# Patient Record
Sex: Female | Born: 1990 | Race: White | Hispanic: No | Marital: Married | Smoking: Never smoker
Health system: Southern US, Community
[De-identification: ages and names within clinical notes are randomized; demographics above are authoritative.]

## PROBLEM LIST (undated history)

## (undated) DIAGNOSIS — O24419 Gestational diabetes mellitus in pregnancy, unspecified control: Secondary | ICD-10-CM

## (undated) DIAGNOSIS — O1200 Gestational edema, unspecified trimester: Secondary | ICD-10-CM

## (undated) DIAGNOSIS — R87629 Unspecified abnormal cytological findings in specimens from vagina: Secondary | ICD-10-CM

## (undated) DIAGNOSIS — K5900 Constipation, unspecified: Secondary | ICD-10-CM

## (undated) DIAGNOSIS — R011 Cardiac murmur, unspecified: Secondary | ICD-10-CM

## (undated) DIAGNOSIS — R7303 Prediabetes: Secondary | ICD-10-CM

## (undated) HISTORY — DX: Cardiac murmur, unspecified: R01.1

## (undated) HISTORY — DX: Constipation, unspecified: K59.00

## (undated) HISTORY — DX: Gestational edema, unspecified trimester: O12.00

## (undated) HISTORY — DX: Prediabetes: R73.03

## (undated) HISTORY — PX: COLPOSCOPY: SHX161

---
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
## 2011-02-16 ENCOUNTER — Emergency Department (HOSPITAL_COMMUNITY)
Admission: EM | Admit: 2011-02-16 | Discharge: 2011-02-16 | Disposition: A | Discharge status: Home / Self Care | Payer: Worker's Compensation | Attending: Emergency Medicine | Admitting: Emergency Medicine

## 2011-02-16 DIAGNOSIS — S0100XA Unspecified open wound of scalp, initial encounter: Secondary | ICD-10-CM | POA: Insufficient documentation

## 2011-02-16 DIAGNOSIS — Y9269 Other specified industrial and construction area as the place of occurrence of the external cause: Secondary | ICD-10-CM | POA: Insufficient documentation

## 2011-02-16 DIAGNOSIS — W2209XA Striking against other stationary object, initial encounter: Secondary | ICD-10-CM | POA: Insufficient documentation

## 2015-03-23 LAB — HM PAP SMEAR: HM Pap smear: NEGATIVE

## 2016-01-18 ENCOUNTER — Ambulatory Visit (INDEPENDENT_AMBULATORY_CARE_PROVIDER_SITE_OTHER): Payer: 59 | Admitting: Pediatrics

## 2016-01-18 ENCOUNTER — Encounter: Payer: Self-pay | Admitting: Pediatrics

## 2016-01-18 VITALS — BP 119/78 | HR 74 | Temp 97.8°F | Ht 68.0 in | Wt 178.0 lb

## 2016-01-18 DIAGNOSIS — G43709 Chronic migraine without aura, not intractable, without status migrainosus: Secondary | ICD-10-CM | POA: Diagnosis not present

## 2016-01-18 DIAGNOSIS — Z0000 Encounter for general adult medical examination without abnormal findings: Secondary | ICD-10-CM

## 2016-01-18 MED ORDER — TOPIRAMATE 25 MG PO TABS: 25.0000 mg | ORAL_TABLET | Freq: Two times a day (BID) | ORAL | Status: DC

## 2016-01-18 MED ORDER — RIZATRIPTAN BENZOATE 10 MG PO TABS: 5.0000 mg | ORAL_TABLET | ORAL | Status: DC | PRN

## 2016-01-18 NOTE — Progress Notes (Signed)
    Subjective:    Patient ID: Bonnie Hughes, female    DOB: 10/27/1991, 25 y.o.   MRN: [id number]  CC: annual exam  HPI: Bonnie Hughes is a 25 y.o. female presenting for New Patient (Initial Visit)  Headaches: Most of the time gets nauseous with HA. Starts at the top, runs down back of head Sometimes has a headache in the morning.  Sometimes has a headache at night, there in the morning when she has a headache. Seen eye doctor last year, had normal vision Week before period HA are more severe or frequent Takes ibuprofen sometimes  Has headaches 2-3 times a week Last a couple of hours Being in dark places helps.   Normal BP today Dad with high cholesterol and HTN PGM had MI in 60s No strokes in family  Sept 2016 had flu shot   Depression screen [facility] 2/9 01/18/2016  Decreased Interest 0  Down, Depressed, Hopeless 0  PHQ - 2 Score 0     ROS: All systems negative other than what is in HPI  History  Smoking status  . Never Smoker   Smokeless tobacco  . Not on file    Past Medical History Patient Active Problem List   Diagnosis Date Noted  . Chronic migraine without aura without status migrainosus, not intractable 01/20/2016   Meds: Yaz OCP     Objective:    BP 119/78 mmHg  Pulse 74  Temp(Src) 97.8 F (36.6 C) (Oral)  Ht '5\' 8"'$  (1.727 m)  Wt 178 lb (80.74 kg)  BMI 27.07 kg/m2  Wt Readings from Last 3 Encounters:  01/18/16 178 lb (80.74 kg)     Gen: NAD, alert, cooperative with exam, NCAT EYES: EOMI, no scleral injection or icterus ENT:  TMs pearly gray b/l, OP without erythema LYMPH: no cervical LAD CV: NRRR, normal S1/S2, no murmur, distal pulses 2+ b/l Resp: CTABL, no wheezes, normal WOB Abd: +BS, soft, NTND. no guarding or organomegaly Ext: No edema, warm Neuro: Alert and oriented, strength equal b/l UE and LE, coordination normal, CNIII-XII normal MSK: normal muscle bulk     Assessment & Plan:    Bonnie Hughes was seen today for annual exam.  Diagnoses  and all orders for this visit:  Encounter for preventive health examination -     BMP8+EGFR -     Lipid panel  Chronic migraine without aura without status migrainosus, not intractable Will start ppx med given frequency of migraines, maxalt for abortive therapy. RTC 4 weeks if not improving. -     topiramate (TOPAMAX) 25 MG tablet; Take 1 tablet (25 mg total) by mouth 2 (two) times daily. After a week increase to '50mg'$  BID -     rizatriptan (MAXALT) 10 MG tablet; Take 0.5-1 tablets (5-10 mg total) by mouth as needed for migraine. May repeat in 2 hours if needed   Follow up plan: 4 weeks or as needed  Assunta Found, MD [facility] 01/18/2016, 12:19 PM

## 2016-01-19 LAB — BMP8+EGFR
BUN/Creatinine Ratio: 15 (ref 8–20)
BUN: 12 mg/dL (ref 6–20)
CO2: 23 mmol/L (ref 18–29)
Calcium: 9.5 mg/dL (ref 8.7–10.2)
Chloride: 102 mmol/L (ref 96–106)
Creatinine, Ser: 0.8 mg/dL (ref 0.57–1.00)
GFR calc Af Amer: 119 mL/min/{1.73_m2} (ref >59)
GFR calc non Af Amer: 103 mL/min/{1.73_m2} (ref >59)
Glucose: 89 mg/dL (ref 65–99)
Potassium: 4.3 mmol/L (ref 3.5–5.2)
Sodium: 141 mmol/L (ref 134–144)

## 2016-01-19 LAB — LIPID PANEL
Chol/HDL Ratio: 2.4 ratio units (ref 0.0–4.4)
Cholesterol, Total: 215 mg/dL — ABNORMAL HIGH (ref 100–199)
HDL: 88 mg/dL (ref >39)
LDL Calculated: 106 mg/dL — ABNORMAL HIGH (ref 0–99)
Triglycerides: 107 mg/dL (ref 0–149)
VLDL Cholesterol Cal: 21 mg/dL (ref 5–40)

## 2016-01-20 DIAGNOSIS — G43709 Chronic migraine without aura, not intractable, without status migrainosus: Secondary | ICD-10-CM | POA: Insufficient documentation

## 2016-01-23 DIAGNOSIS — H43393 Other vitreous opacities, bilateral: Secondary | ICD-10-CM | POA: Diagnosis not present

## 2016-01-23 DIAGNOSIS — H52223 Regular astigmatism, bilateral: Secondary | ICD-10-CM | POA: Diagnosis not present

## 2016-01-23 DIAGNOSIS — H5213 Myopia, bilateral: Secondary | ICD-10-CM | POA: Diagnosis not present

## 2016-01-27 ENCOUNTER — Encounter: Payer: Self-pay | Admitting: Pediatrics

## 2016-01-29 ENCOUNTER — Encounter: Payer: Self-pay | Admitting: *Deleted

## 2016-01-30 ENCOUNTER — Encounter: Payer: Self-pay | Admitting: *Deleted

## 2016-06-25 DIAGNOSIS — Z124 Encounter for screening for malignant neoplasm of cervix: Secondary | ICD-10-CM | POA: Diagnosis not present

## 2016-06-25 DIAGNOSIS — Z01419 Encounter for gynecological examination (general) (routine) without abnormal findings: Secondary | ICD-10-CM | POA: Diagnosis not present

## 2016-06-25 DIAGNOSIS — Z6827 Body mass index (BMI) 27.0-27.9, adult: Secondary | ICD-10-CM | POA: Diagnosis not present

## 2016-09-02 DIAGNOSIS — N943 Premenstrual tension syndrome: Secondary | ICD-10-CM | POA: Diagnosis not present

## 2017-01-05 ENCOUNTER — Telehealth: Payer: Worker's Compensation | Admitting: Physician Assistant

## 2017-01-05 DIAGNOSIS — H109 Unspecified conjunctivitis: Secondary | ICD-10-CM

## 2017-01-05 MED ORDER — POLYMYXIN B-TRIMETHOPRIM 10000-0.1 UNIT/ML-% OP SOLN: OPHTHALMIC | 0 refills | Status: DC

## 2017-01-05 NOTE — Progress Notes (Signed)

## 2017-02-04 DIAGNOSIS — H5213 Myopia, bilateral: Secondary | ICD-10-CM | POA: Diagnosis not present

## 2017-02-19 DIAGNOSIS — D485 Neoplasm of uncertain behavior of skin: Secondary | ICD-10-CM | POA: Diagnosis not present

## 2017-02-19 DIAGNOSIS — D235 Other benign neoplasm of skin of trunk: Secondary | ICD-10-CM | POA: Diagnosis not present

## 2017-05-02 DIAGNOSIS — M9901 Segmental and somatic dysfunction of cervical region: Secondary | ICD-10-CM | POA: Diagnosis not present

## 2017-05-02 DIAGNOSIS — M9903 Segmental and somatic dysfunction of lumbar region: Secondary | ICD-10-CM | POA: Diagnosis not present

## 2017-05-02 DIAGNOSIS — M9905 Segmental and somatic dysfunction of pelvic region: Secondary | ICD-10-CM | POA: Diagnosis not present

## 2017-05-02 DIAGNOSIS — M9902 Segmental and somatic dysfunction of thoracic region: Secondary | ICD-10-CM | POA: Diagnosis not present

## 2017-05-05 DIAGNOSIS — L508 Other urticaria: Secondary | ICD-10-CM | POA: Diagnosis not present

## 2017-05-05 DIAGNOSIS — L0100 Impetigo, unspecified: Secondary | ICD-10-CM | POA: Diagnosis not present

## 2017-05-06 DIAGNOSIS — M9903 Segmental and somatic dysfunction of lumbar region: Secondary | ICD-10-CM | POA: Diagnosis not present

## 2017-05-06 DIAGNOSIS — M9905 Segmental and somatic dysfunction of pelvic region: Secondary | ICD-10-CM | POA: Diagnosis not present

## 2017-05-06 DIAGNOSIS — M9902 Segmental and somatic dysfunction of thoracic region: Secondary | ICD-10-CM | POA: Diagnosis not present

## 2017-05-06 DIAGNOSIS — M9901 Segmental and somatic dysfunction of cervical region: Secondary | ICD-10-CM | POA: Diagnosis not present

## 2017-05-20 DIAGNOSIS — M9901 Segmental and somatic dysfunction of cervical region: Secondary | ICD-10-CM | POA: Diagnosis not present

## 2017-05-20 DIAGNOSIS — M9902 Segmental and somatic dysfunction of thoracic region: Secondary | ICD-10-CM | POA: Diagnosis not present

## 2017-05-20 DIAGNOSIS — M9903 Segmental and somatic dysfunction of lumbar region: Secondary | ICD-10-CM | POA: Diagnosis not present

## 2017-05-20 DIAGNOSIS — M9905 Segmental and somatic dysfunction of pelvic region: Secondary | ICD-10-CM | POA: Diagnosis not present

## 2017-07-04 DIAGNOSIS — Z01419 Encounter for gynecological examination (general) (routine) without abnormal findings: Secondary | ICD-10-CM | POA: Diagnosis not present

## 2017-12-01 ENCOUNTER — Telehealth: Payer: Worker's Compensation | Admitting: Family

## 2017-12-01 DIAGNOSIS — J208 Acute bronchitis due to other specified organisms: Secondary | ICD-10-CM

## 2017-12-01 DIAGNOSIS — B9689 Other specified bacterial agents as the cause of diseases classified elsewhere: Secondary | ICD-10-CM

## 2017-12-01 MED ORDER — BENZONATATE 100 MG PO CAPS: 100.0000 mg | ORAL_CAPSULE | Freq: Three times a day (TID) | ORAL | 0 refills | Status: DC | PRN

## 2017-12-01 MED ORDER — AZITHROMYCIN 250 MG PO TABS: ORAL_TABLET | ORAL | 0 refills | Status: DC

## 2017-12-01 NOTE — Progress Notes (Signed)

## 2017-12-22 DIAGNOSIS — H5213 Myopia, bilateral: Secondary | ICD-10-CM | POA: Diagnosis not present

## 2017-12-30 ENCOUNTER — Ambulatory Visit (INDEPENDENT_AMBULATORY_CARE_PROVIDER_SITE_OTHER): Payer: 59 | Admitting: Podiatry

## 2017-12-30 ENCOUNTER — Encounter: Payer: Self-pay | Admitting: Podiatry

## 2017-12-30 VITALS — BP 93/62 | HR 69 | Resp 16

## 2017-12-30 DIAGNOSIS — L600 Ingrowing nail: Secondary | ICD-10-CM

## 2017-12-30 DIAGNOSIS — N926 Irregular menstruation, unspecified: Secondary | ICD-10-CM | POA: Insufficient documentation

## 2017-12-30 MED ORDER — NEOMYCIN-POLYMYXIN-HC 1 % OT SOLN: OTIC | 1 refills | Status: DC

## 2017-12-30 NOTE — Progress Notes (Signed)
Subjective:  Patient ID: Bonnie Hughes, female    DOB: October 02, 1991,  MRN: [id number] HPI Chief Complaint  Patient presents with  . Toe Pain    Hallux and 2nd toe right - medial borders, intermittent x years    27 y.o. female presents with the above complaint.     Past Medical History:  Diagnosis Date  . Heart murmur    No past surgical history on file.  Current Outpatient Medications:  .  drospirenone-ethinyl estradiol (YAZ) 3-0.02 MG tablet, YAZ (28) 3 mg-0.02 mg tablet  Take 1 tab daily x 24 days, skip placebo pills and immediately start new pack, Disp: , Rfl:  .  Multiple Vitamin (MULTIVITAMIN) tablet, Take 1 tablet by mouth daily., Disp: , Rfl:  .  rizatriptan (MAXALT) 10 MG tablet, Take 0.5-1 tablets (5-10 mg total) by mouth as needed for migraine. May repeat in 2 hours if needed, Disp: 10 tablet, Rfl: 1 .  topiramate (TOPAMAX) 25 MG tablet, Take 1 tablet (25 mg total) by mouth 2 (two) times daily. After a week increase to 50mg  BID, Disp: 98 tablet, Rfl: 1 .  trimethoprim-polymyxin b (POLYTRIM) ophthalmic solution, Apply 1-2 drops into affected eye, 4 times daily x 5 days, Disp: 10 mL, Rfl: 0  No Known Allergies Review of Systems  All other systems reviewed and are negative.  Objective:   Vitals:   12/30/17 1021  BP: 93/62  Pulse: 69  Resp: 16    General: Well developed, nourished, in no acute distress, alert and oriented x3   Dermatological: Skin is warm, dry and supple bilateral. Nails x 10 are well maintained; remaining integument appears unremarkable at this time. There are no open sores, no preulcerative lesions, no rash or signs of infection present.  Sharply incurvated nail margins along the tibial border of the first and second toes of the right foot.  Mild erythema persists along the medial aspect of both of these toes.  They are tender on palpation.  Vascular: Dorsalis Pedis artery and Posterior Tibial artery pedal pulses are 2/4 bilateral with immedate  capillary fill time. Pedal hair growth present. No varicosities and no lower extremity edema present bilateral.   Neruologic: Grossly intact via light touch bilateral. Vibratory intact via tuning fork bilateral. Protective threshold with Semmes Wienstein monofilament intact to all pedal sites bilateral. Patellar and Achilles deep tendon reflexes 2+ bilateral. No Babinski or clonus noted bilateral.   Musculoskeletal: No gross boney pedal deformities bilateral. No pain, crepitus, or limitation noted with foot and ankle range of motion bilateral. Muscular strength 5/5 in all groups tested bilateral she has some tenderness on palpation of the first metatarsophalangeal joint left foot.  Mild hallux valgus deformity..  Gait: Unassisted, Nonantalgic.    Radiographs:  None taken  Assessment & Plan:   Assessment: Ingrown toenail tibial border hallux right.  Ingrown toenail tibial border second digit right.  Plan: We discussed the etiology pathology conservative versus surgical therapies.  At this point after consent was given we prepared her for matrixectomy tibial border of the first and second toes of the right foot.  We injected her with 3 cc 50-50 mixture of Marcaine plain and lidocaine plain to the first and second toes of the right foot.  She tolerated this well.  The area was then prepped and draped as normal sterile fashion.  Chemical matrixectomy's were performed along the tibial borders of both toes.  She tolerated this procedure well dry sterile compressive dressing was applied she was given  both oral and written home-going instructions for the care and soaking of her toes as well as a prescription for Cortisporin Otic to be applied twice daily after soaking.     Max T. Obetz, [place]

## 2017-12-30 NOTE — Patient Instructions (Signed)

## 2018-01-15 ENCOUNTER — Encounter: Payer: Self-pay | Admitting: Podiatry

## 2018-01-15 ENCOUNTER — Ambulatory Visit (INDEPENDENT_AMBULATORY_CARE_PROVIDER_SITE_OTHER): Payer: 59 | Admitting: Podiatry

## 2018-01-15 DIAGNOSIS — L600 Ingrowing nail: Secondary | ICD-10-CM

## 2018-01-15 NOTE — Patient Instructions (Signed)

## 2018-01-18 NOTE — Progress Notes (Signed)
She presents today for follow-up of her nail procedure of the first and second toes of the right foot.  She states that is doing very well she is happy with the outcome thus far she states that it really has not bothered her except a little bit of the second toe.  She continues to soak.  Objective: Vital signs are stable she is alert and oriented x3 there is no erythema edema cellulitis drainage or odor margins appear to be healing very nicely.  I see no signs of infection.  Assessment: Well-healing matrixectomy's first and second digits of the right foot.  Plan: I encouraged her to continue to soak until all the redness drainage tenderness has gone away.  She understands that and is amenable to it we will follow-up with me on an as-needed basis she will cover during the day but leave open at bedtime.

## 2018-01-20 DIAGNOSIS — D1800 Hemangioma unspecified site: Secondary | ICD-10-CM | POA: Diagnosis not present

## 2018-01-20 DIAGNOSIS — D229 Melanocytic nevi, unspecified: Secondary | ICD-10-CM | POA: Diagnosis not present

## 2018-02-28 ENCOUNTER — Encounter: Payer: Self-pay | Admitting: Pediatrics

## 2018-03-06 ENCOUNTER — Encounter: Payer: Self-pay | Admitting: Pediatrics

## 2018-03-06 ENCOUNTER — Ambulatory Visit (INDEPENDENT_AMBULATORY_CARE_PROVIDER_SITE_OTHER): Payer: 59 | Admitting: Pediatrics

## 2018-03-06 VITALS — BP 122/71 | HR 76 | Temp 97.5°F | Ht 68.0 in | Wt 183.8 lb

## 2018-03-06 DIAGNOSIS — R5383 Other fatigue: Secondary | ICD-10-CM | POA: Diagnosis not present

## 2018-03-06 DIAGNOSIS — R41840 Attention and concentration deficit: Secondary | ICD-10-CM

## 2018-03-06 NOTE — Progress Notes (Signed)
Subjective:   Patient ID: Bonnie Hughes, female    DOB: 1990/12/12, 27 y.o.   MRN: [id number] CC: Trouble Focusing  HPI: Bonnie Hughes is a 27 y.o. 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   Has noticed that she is having worsening time focusing for about the past year.  Trouble focusing on EMS report and not getting distracted with activity is going around her.  More easily falling asleep when sitting through classes at work.  Not able to do as much housework straight through as she used to, will find herself reading a magazine that she found while cleaning instead.  At work people noticed her having trouble focusing.   About 3 months ago she switched her schedule from 7P to 7 AM to 3 PM to 3 AM which is helps with her sleep schedule and being more on schedule with her husband.  She sleeps about 10 hours at night on the nights that she does not work.  She sleeps 6-7 hours after working a shift.  She does not have any trouble falling asleep.  Does not think she is excessively worrying.  She thinks her mood is been fine.  She says she was a good Games developer school, high school, college.  She never had problems focusing at that time.  Never had symptoms of ADHD or attention problems prior to the past year.  No history of thyroid problems in her family.  She is trying to exercise regularly, about 30 minutes 6 days a week.  Avoiding sodas, trying to eat healthy foods.  Was having regular periods until she started skipping the placebo pills and her birth control per OB recommendations.  Has helped a lot with her migraines.  Now taking ibuprofen for headaches when she has them with good control.  Relevant past medical, surgical, family and social history reviewed. Allergies and medications reviewed and updated. Social History   Tobacco Use  Smoking Status Never Smoker  Smokeless Tobacco Never Used   ROS: Per HPI   Objective:    BP  122/71   Pulse 76   Temp (!) 97.5 F (36.4 C) (Oral)   Ht 5\' 8"  (1.727 m)   Wt 183 lb 12.8 oz (83.4 kg)   BMI 27.95 kg/m   Wt Readings from Last 3 Encounters:  03/06/18 183 lb 12.8 oz (83.4 kg)  01/18/16 178 lb (80.7 kg)    Gen: NAD, alert, cooperative with exam, NCAT EYES: EOMI, no conjunctival injection, or no icterus ENT:  TMs pearly gray b/l, OP without erythema LYMPH: no cervical LAD CV: NRRR, normal S1/S2, no murmur, distal pulses 2+ b/l Resp: CTABL, no wheezes, normal WOB Abd: +BS, soft, NTND. no guarding or organomegaly Ext: No edema, warm Neuro: Alert and oriented, strength equal b/l UE and LE, coordination grossly normal MSK: normal muscle bulk  Assessment & Plan:  Ileta was seen today for trouble focusing.  Diagnoses and all orders for this visit:  Other fatigue -     TSH -     Basic Metabolic Panel -     CBC with Differential/Platelet  Poor concentration No symptoms of ADHD until the past year.  Will get blood work to rule out other medical condition.  Not obviously with mood problems or anxiety with low risk PHQ 9 and gad 7.  Will refer to [place] [facility] for further evaluation. -     Ambulatory referral to Psychiatry  Follow up plan: Return in about 8 weeks (around 05/01/2018). Assunta Found, MD [place]

## 2018-03-06 NOTE — Patient Instructions (Signed)
Your provider wants you to schedule an appointment with a Psychologist/Psychiatrist. The following list of offices requires the patient to call and make their own appointment, as there is information they need that only you can provide. Please feel free to choose form the following providers:  [facility] in [place]   [facility]  [place], [place]  Does Adult ADD evaluations Does not accept Medicaid

## 2018-03-07 LAB — CBC WITH DIFFERENTIAL/PLATELET
BASOS: 1 %
Basophils Absolute: 0 10*3/uL (ref 0.0–0.2)
EOS (ABSOLUTE): 0.1 10*3/uL (ref 0.0–0.4)
EOS: 1 %
HEMATOCRIT: 41.1 % (ref 34.0–46.6)
HEMOGLOBIN: 13.6 g/dL (ref 11.1–15.9)
Immature Grans (Abs): 0 10*3/uL (ref 0.0–0.1)
Immature Granulocytes: 0 %
Lymphocytes Absolute: 3.5 10*3/uL — ABNORMAL HIGH (ref 0.7–3.1)
Lymphs: 49 %
MCH: 31.5 pg (ref 26.6–33.0)
MCHC: 33.1 g/dL (ref 31.5–35.7)
MCV: 95 fL (ref 79–97)
MONOCYTES: 12 %
MONOS ABS: 0.9 10*3/uL (ref 0.1–0.9)
NEUTROS ABS: 2.7 10*3/uL (ref 1.4–7.0)
Neutrophils: 37 %
Platelets: 251 10*3/uL (ref 150–379)
RBC: 4.32 x10E6/uL (ref 3.77–5.28)
RDW: 13.2 % (ref 12.3–15.4)
WBC: 7.2 10*3/uL (ref 3.4–10.8)

## 2018-03-07 LAB — BASIC METABOLIC PANEL
BUN/Creatinine Ratio: 16 (ref 9–23)
BUN: 11 mg/dL (ref 6–20)
CO2: 24 mmol/L (ref 20–29)
CREATININE: 0.69 mg/dL (ref 0.57–1.00)
Calcium: 9.3 mg/dL (ref 8.7–10.2)
Chloride: 105 mmol/L (ref 96–106)
GFR calc non Af Amer: 120 mL/min/{1.73_m2} (ref >59)
GFR, EST AFRICAN AMERICAN: 138 mL/min/{1.73_m2} (ref >59)
Glucose: 94 mg/dL (ref 65–99)
Potassium: 4.6 mmol/L (ref 3.5–5.2)
SODIUM: 141 mmol/L (ref 134–144)

## 2018-03-07 LAB — TSH: TSH: 3.47 u[IU]/mL (ref 0.450–4.500)

## 2018-03-24 DIAGNOSIS — F419 Anxiety disorder, unspecified: Secondary | ICD-10-CM | POA: Diagnosis not present

## 2018-03-24 DIAGNOSIS — R41840 Attention and concentration deficit: Secondary | ICD-10-CM | POA: Diagnosis not present

## 2018-05-01 ENCOUNTER — Encounter: Payer: Self-pay | Admitting: Pediatrics

## 2018-05-01 ENCOUNTER — Ambulatory Visit (INDEPENDENT_AMBULATORY_CARE_PROVIDER_SITE_OTHER): Payer: 59 | Admitting: Pediatrics

## 2018-05-01 VITALS — BP 118/74 | HR 74 | Temp 98.2°F | Ht 68.0 in | Wt 181.4 lb

## 2018-05-01 DIAGNOSIS — Z0000 Encounter for general adult medical examination without abnormal findings: Secondary | ICD-10-CM

## 2018-05-01 DIAGNOSIS — Z6827 Body mass index (BMI) 27.0-27.9, adult: Secondary | ICD-10-CM

## 2018-05-01 NOTE — Progress Notes (Signed)
  Subjective:   Patient ID: Bonnie Hughes, female    DOB: 12-23-1990, 27 y.o.   MRN: [id number] CC: Annual and attention follow-up HPI: Bonnie Hughes is a 27 y.o. female   She was seen by [place] [facility] for attention concerns.  She was told she does not have ADHD, she is relieved by this.  She does have some tendencies towards OCD per their evaluation, she says that makes sense now with her symptoms.  She is not interested in any further intervention at this time.  Attention at work has been okay.  Mood has been fine.  Exercising regularly.  Trying to make healthier choices with eating.  Relevant past medical, surgical, family and social history reviewed. Allergies and medications reviewed and updated. Social History   Tobacco Use  Smoking Status Never Smoker  Smokeless Tobacco Never Used   ROS: All systems negative other than what is in the HPI  Objective:    BP 118/74   Pulse 74   Temp 98.2 F (36.8 C) (Oral)   Ht 5\' 8"  (1.727 m)   Wt 181 lb 6.4 oz (82.3 kg)   BMI 27.58 kg/m   Wt Readings from Last 3 Encounters:  05/01/18 181 lb 6.4 oz (82.3 kg)  03/06/18 183 lb 12.8 oz (83.4 kg)  01/18/16 178 lb (80.7 kg)    Gen: NAD, alert, cooperative with exam, NCAT EYES: EOMI, no conjunctival injection, or no icterus ENT:  TMs pearly gray b/l, OP without erythema LYMPH: no cervical LAD CV: NRRR, normal S1/S2, no murmur, distal pulses 2+ b/l Resp: CTABL, no wheezes, normal WOB Abd: +BS, soft, NTND. no guarding or organomegaly Ext: No edema, warm Neuro: Alert and oriented, strength equal b/l UE and LE, coordination grossly normal MSK: normal muscle bulk  Assessment & Plan:  Bonnie Hughes was seen today for medical management of chronic issues.  Diagnoses and all orders for this visit:  Encounter for preventive health examination Pap smear: Last in 2017, follows at [facility]  BMI 27.0-27.9,adult Continue lifestyle changes  Follow up plan: Return in about 1  year (around 05/02/2019). Assunta Found, MD [place]

## 2018-08-24 DIAGNOSIS — Z01419 Encounter for gynecological examination (general) (routine) without abnormal findings: Secondary | ICD-10-CM | POA: Diagnosis not present

## 2018-12-10 ENCOUNTER — Telehealth: Payer: Worker's Compensation | Admitting: Family

## 2018-12-10 DIAGNOSIS — B9689 Other specified bacterial agents as the cause of diseases classified elsewhere: Secondary | ICD-10-CM

## 2018-12-10 DIAGNOSIS — J208 Acute bronchitis due to other specified organisms: Secondary | ICD-10-CM

## 2018-12-10 MED ORDER — AZITHROMYCIN 250 MG PO TABS: ORAL_TABLET | ORAL | 0 refills | Status: DC

## 2018-12-10 MED ORDER — BENZONATATE 100 MG PO CAPS: 100.0000 mg | ORAL_CAPSULE | Freq: Three times a day (TID) | ORAL | 0 refills | Status: DC | PRN

## 2018-12-10 MED ORDER — ALBUTEROL SULFATE 108 (90 BASE) MCG/ACT IN AEPB: 2.0000 | INHALATION_SPRAY | Freq: Four times a day (QID) | RESPIRATORY_TRACT | 2 refills | Status: DC | PRN

## 2018-12-10 NOTE — Progress Notes (Signed)
Thank you for the details you included in the comment boxes. Those details are very helpful in determining the best course of treatment for you and help [place] to provide the best care.  We are sorry that you are not feeling well.  Here is how we plan to help!  Based on your presentation I believe you most likely have A cough due to bacteria.  When patients have a fever and a productive cough with a change in color or increased sputum production, we are concerned about bacterial bronchitis.  If left untreated it can progress to pneumonia.  If your symptoms do not improve with your treatment plan it is important that you contact your provider.   I have prescribed Azithromyin 250 mg: two tablets now and then one tablet daily for 4 additonal days    In addition you may use A non-prescription cough medication called Mucinex DM: take 2 tablets every 12 hours. and A prescription cough medication called Tessalon Perles 100mg . You may take 1-2 capsules every 8 hours as needed for your cough.  I have also sent an albuterol inhaler, take 2 puffs every 6 hours as needed for cough.   From your responses in the eVisit questionnaire you describe inflammation in the upper respiratory tract which is causing a significant cough.  This is commonly called Bronchitis and has four common causes:    Allergies  Viral Infections  Acid Reflux  Bacterial Infection Allergies, viruses and acid reflux are treated by controlling symptoms or eliminating the cause. An example might be a cough caused by taking certain blood pressure medications. You stop the cough by changing the medication. Another example might be a cough caused by acid reflux. Controlling the reflux helps control the cough.  USE OF BRONCHODILATOR ("RESCUE") INHALERS: There is a risk from using your bronchodilator too frequently.  The risk is that over-reliance on a medication which only relaxes the muscles surrounding the breathing tubes can reduce the  effectiveness of medications prescribed to reduce swelling and congestion of the tubes themselves.  Although you feel brief relief from the bronchodilator inhaler, your asthma may actually be worsening with the tubes becoming more swollen and filled with mucus.  This can delay other crucial treatments, such as oral steroid medications. If you need to use a bronchodilator inhaler daily, several times per day, you should discuss this with your provider.  There are probably better treatments that could be used to keep your asthma under control.     HOME CARE . Only take medications as instructed by your medical team. . Complete the entire course of an antibiotic. . Drink plenty of fluids and get plenty of rest. . Avoid close contacts especially the very young and the elderly . Cover your mouth if you cough or cough into your sleeve. . Always remember to wash your hands . A steam or ultrasonic humidifier can help congestion.   GET HELP RIGHT AWAY IF: . You develop worsening fever. . You become short of breath . You cough up blood. . Your symptoms persist after you have completed your treatment plan MAKE SURE YOU   Understand these instructions.  Will watch your condition.  Will get help right away if you are not doing well or get worse.  Your e-visit answers were reviewed by a board certified advanced clinical practitioner to complete your personal care plan.  Depending on the condition, your plan could have included both over the counter or prescription medications. If there is a  problem please reply  once you have received a response from your provider. Your safety is important to [place].  If you have drug allergies check your prescription carefully.    You can use MyChart to ask questions about today's visit, request a non-urgent call back, or ask for a work or school excuse for 24 hours related to this e-Visit. If it has been greater than 24 hours you will need to follow up with your  provider, or enter a new e-Visit to address those concerns. You will get an e-mail in the next two days asking about your experience.  I hope that your e-visit has been valuable and will speed your recovery. Thank you for using e-visits.

## 2018-12-10 NOTE — Progress Notes (Signed)
Bonnie Hughes, I cannot say for sure if this is contagious or not best practice is to use good cough hygiene and good handwashing, using treatments as prescribed to ensure a swift recovery.

## 2019-06-21 DIAGNOSIS — H5213 Myopia, bilateral: Secondary | ICD-10-CM | POA: Diagnosis not present

## 2019-06-24 ENCOUNTER — Other Ambulatory Visit: Payer: Self-pay

## 2019-06-24 ENCOUNTER — Ambulatory Visit (INDEPENDENT_AMBULATORY_CARE_PROVIDER_SITE_OTHER): Payer: 59 | Admitting: Family

## 2019-06-24 ENCOUNTER — Encounter: Payer: Self-pay | Admitting: Family

## 2019-06-24 VITALS — BP 113/71 | HR 77 | Temp 98.3°F | Ht 68.0 in | Wt 195.0 lb

## 2019-06-24 DIAGNOSIS — Z0001 Encounter for general adult medical examination with abnormal findings: Secondary | ICD-10-CM | POA: Diagnosis not present

## 2019-06-24 DIAGNOSIS — Z0000 Encounter for general adult medical examination without abnormal findings: Secondary | ICD-10-CM

## 2019-06-24 DIAGNOSIS — E663 Overweight: Secondary | ICD-10-CM | POA: Diagnosis not present

## 2019-06-24 DIAGNOSIS — G43709 Chronic migraine without aura, not intractable, without status migrainosus: Secondary | ICD-10-CM | POA: Diagnosis not present

## 2019-06-24 DIAGNOSIS — Z23 Encounter for immunization: Secondary | ICD-10-CM

## 2019-06-24 NOTE — Progress Notes (Signed)
Subjective:    Patient ID: Bonnie Hughes, female    DOB: Dec 24, 1990, 28 y.o.   MRN: [id number]  Chief Complaint  Patient presents with  . Annual Exam    no pap   Pt presents to the office today for CPE without pap. Pt currently stopped her OC and trying to become pregnant.  Headache  This is a chronic problem. The current episode started more than 1 year ago. Episode frequency: approx 6 a year. The problem has been waxing and waning. The pain is located in the occipital region. The pain does not radiate. The pain quality is similar to prior headaches. Associated symptoms include nausea, phonophobia and photophobia. Pertinent negatives include no vomiting. She has tried acetaminophen and Excedrin for the symptoms. The treatment provided moderate relief.      Review of Systems  Eyes: Positive for photophobia.  Gastrointestinal: Positive for nausea. Negative for vomiting.  Neurological: Positive for headaches.  All other systems reviewed and are negative.  Family History  Problem Relation Age of Onset  . Hyperlipidemia Father   . Hypertension Father   . Anemia Maternal Grandmother   . Hyperlipidemia Paternal Grandmother   . Hypertension Paternal Grandmother   . Heart disease Paternal Grandmother   . Glaucoma Paternal Grandmother     Social History   Socioeconomic History  . Marital status: Married    Spouse name: Not on file  . Number of children: Not on file  . Years of education: Not on file  . Highest education level: Not on file  Occupational History  . Not on file  Social Needs  . Financial resource strain: Not on file  . Food insecurity    Worry: Not on file    Inability: Not on file  . Transportation needs    Medical: Not on file    Non-medical: Not on file  Tobacco Use  . Smoking status: Never Smoker  . Smokeless tobacco: Never Used  Substance and Sexual Activity  . Alcohol use: No  . Drug use: No  . Sexual activity: Yes    Birth control/protection:  Pill  Lifestyle  . Physical activity    Days per week: Not on file    Minutes per session: Not on file  . Stress: Not on file  Relationships  . Social Herbalist on phone: Not on file    Gets together: Not on file    Attends religious service: Not on file    Active member of club or organization: Not on file    Attends meetings of clubs or organizations: Not on file    Relationship status: Not on file  Other Topics Concern  . Not on file  Social History Narrative  . Not on file        Objective:   Physical Exam Vitals signs reviewed.  Constitutional:      General: She is not in acute distress.    Appearance: She is well-developed.  HENT:     Head: Normocephalic and atraumatic.     Right Ear: Tympanic membrane normal.     Left Ear: Tympanic membrane normal.  Eyes:     Pupils: Pupils are equal, round, and reactive to light.  Neck:     Musculoskeletal: Normal range of motion and neck supple.     Thyroid: No thyromegaly.  Cardiovascular:     Rate and Rhythm: Normal rate and regular rhythm.     Heart sounds: Normal heart sounds. No  murmur.  Pulmonary:     Effort: Pulmonary effort is normal. No respiratory distress.     Breath sounds: Normal breath sounds. No wheezing.  Abdominal:     General: Bowel sounds are normal. There is no distension.     Palpations: Abdomen is soft.     Tenderness: There is no abdominal tenderness.  Musculoskeletal: Normal range of motion.        General: No tenderness.  Skin:    General: Skin is warm and dry.  Neurological:     Mental Status: She is alert and oriented to person, place, and time.     Cranial Nerves: No cranial nerve deficit.     Deep Tendon Reflexes: Reflexes are normal and symmetric.  Psychiatric:        Behavior: Behavior normal.        Thought Content: Thought content normal.        Judgment: Judgment normal.       BP 113/71   Pulse 77   Temp 98.3 F (36.8 C) (Oral)   Ht '5\' 8"'  (1.727 m)   Wt 195 lb  (88.5 kg)   LMP 06/09/2019 (Approximate)   BMI 29.65 kg/m      Assessment & Plan:  [name] comes in today with chief complaint of Annual Exam (no pap)   Diagnosis and orders addressed:  1. Overweight (BMI 25.0-29.9) - CMP14+EGFR - CBC with Differential/Platelet  2. Chronic migraine without aura without status migrainosus, not intractable - CMP14+EGFR - CBC with Differential/Platelet  3. Annual physical exam - CMP14+EGFR - CBC with Differential/Platelet - Lipid panel - TSH   Labs pending Health Maintenance reviewed Diet and exercise encouraged  Follow up plan: 1 year    Evelina Dun, FNP

## 2019-06-24 NOTE — Patient Instructions (Signed)
Health Maintenance, Female Adopting a healthy lifestyle and getting preventive care are important in promoting health and wellness. Ask your health care provider about:  The right schedule for you to have regular tests and exams.  Things you can do on your own to prevent diseases and keep yourself healthy. What should I know about diet, weight, and exercise? Eat a healthy diet   Eat a diet that includes plenty of vegetables, fruits, low-fat dairy products, and lean protein.  Do not eat a lot of foods that are high in solid fats, added sugars, or sodium. Maintain a healthy weight Body mass index (BMI) is used to identify weight problems. It estimates body fat based on height and weight. Your health care provider can help determine your BMI and help you achieve or maintain a healthy weight. Get regular exercise Get regular exercise. This is one of the most important things you can do for your health. Most adults should:  Exercise for at least 150 minutes each week. The exercise should increase your heart rate and make you sweat (moderate-intensity exercise).  Do strengthening exercises at least twice a week. This is in addition to the moderate-intensity exercise.  Spend less time sitting. Even light physical activity can be beneficial. Watch cholesterol and blood lipids Have your blood tested for lipids and cholesterol at 29 years of age, then have this test every 5 years. Have your cholesterol levels checked more often if:  Your lipid or cholesterol levels are high.  You are older than 29 years of age.  You are at high risk for heart disease. What should I know about cancer screening? Depending on your health history and family history, you may need to have cancer screening at various ages. This may include screening for:  Breast cancer.  Cervical cancer.  Colorectal cancer.  Skin cancer.  Lung cancer. What should I know about heart disease, diabetes, and high blood  pressure? Blood pressure and heart disease  High blood pressure causes heart disease and increases the risk of stroke. This is more likely to develop in people who have high blood pressure readings, are of African descent, or are overweight.  Have your blood pressure checked: ? Every 3-5 years if you are 18-39 years of age. ? Every year if you are 40 years old or older. Diabetes Have regular diabetes screenings. This checks your fasting blood sugar level. Have the screening done:  Once every three years after age 40 if you are at a normal weight and have a low risk for diabetes.  More often and at a younger age if you are overweight or have a high risk for diabetes. What should I know about preventing infection? Hepatitis B If you have a higher risk for hepatitis B, you should be screened for this virus. Talk with your health care provider to find out if you are at risk for hepatitis B infection. Hepatitis C Testing is recommended for:  Everyone born from 1945 through 1965.  Anyone with known risk factors for hepatitis C. Sexually transmitted infections (STIs)  Get screened for STIs, including gonorrhea and chlamydia, if: ? You are sexually active and are younger than 29 years of age. ? You are older than 29 years of age and your health care provider tells you that you are at risk for this type of infection. ? Your sexual activity has changed since you were last screened, and you are at increased risk for chlamydia or gonorrhea. Ask your health care provider if   you are at risk.  Ask your health care provider about whether you are at high risk for HIV. Your health care provider may recommend a prescription medicine to help prevent HIV infection. If you choose to take medicine to prevent HIV, you should first get tested for HIV. You should then be tested every 3 months for as long as you are taking the medicine. Pregnancy  If you are about to stop having your period (premenopausal) and  you may become pregnant, seek counseling before you get pregnant.  Take 400 to 800 micrograms (mcg) of folic acid every day if you become pregnant.  Ask for birth control (contraception) if you want to prevent pregnancy. Osteoporosis and menopause Osteoporosis is a disease in which the bones lose minerals and strength with aging. This can result in bone fractures. If you are 65 years old or older, or if you are at risk for osteoporosis and fractures, ask your health care provider if you should:  Be screened for bone loss.  Take a calcium or vitamin D supplement to lower your risk of fractures.  Be given hormone replacement therapy (HRT) to treat symptoms of menopause. Follow these instructions at home: Lifestyle  Do not use any products that contain nicotine or tobacco, such as cigarettes, e-cigarettes, and chewing tobacco. If you need help quitting, ask your health care provider.  Do not use street drugs.  Do not share needles.  Ask your health care provider for help if you need support or information about quitting drugs. Alcohol use  Do not drink alcohol if: ? Your health care provider tells you not to drink. ? You are pregnant, may be pregnant, or are planning to become pregnant.  If you drink alcohol: ? Limit how much you use to 0-1 drink a day. ? Limit intake if you are breastfeeding.  Be aware of how much alcohol is in your drink. In the [place] one drink equals one 12 oz bottle of beer (355 mL), one 5 oz glass of wine (148 mL), or one 1 oz glass of hard liquor (44 mL). General instructions  Schedule regular health, dental, and eye exams.  Stay current with your vaccines.  Tell your health care provider if: ? You often feel depressed. ? You have ever been abused or do not feel safe at home. Summary  Adopting a healthy lifestyle and getting preventive care are important in promoting health and wellness.  Follow your health care provider's instructions about healthy  diet, exercising, and getting tested or screened for diseases.  Follow your health care provider's instructions on monitoring your cholesterol and blood pressure. This information is not intended to replace advice given to you by your health care provider. Make sure you discuss any questions you have with your health care provider. Document Released: 06/10/2011 Document Revised: 11/18/2018 Document Reviewed: 11/18/2018 Elsevier Patient Education  2020 Elsevier Inc.  

## 2019-06-25 LAB — CMP14+EGFR
ALT: 17 IU/L (ref 0–32)
AST: 22 IU/L (ref 0–40)
Albumin/Globulin Ratio: 1.9 (ref 1.2–2.2)
Albumin: 4.4 g/dL (ref 3.9–5.0)
Alkaline Phosphatase: 76 IU/L (ref 39–117)
BUN/Creatinine Ratio: 17 (ref 9–23)
BUN: 13 mg/dL (ref 6–20)
Bilirubin Total: 0.3 mg/dL (ref 0.0–1.2)
CO2: 22 mmol/L (ref 20–29)
Calcium: 9.5 mg/dL (ref 8.7–10.2)
Chloride: 100 mmol/L (ref 96–106)
Creatinine, Ser: 0.77 mg/dL (ref 0.57–1.00)
GFR calc Af Amer: 122 mL/min/{1.73_m2} (ref >59)
GFR calc non Af Amer: 105 mL/min/{1.73_m2} (ref >59)
Globulin, Total: 2.3 g/dL (ref 1.5–4.5)
Glucose: 95 mg/dL (ref 65–99)
Potassium: 4.3 mmol/L (ref 3.5–5.2)
Sodium: 138 mmol/L (ref 134–144)
Total Protein: 6.7 g/dL (ref 6.0–8.5)

## 2019-06-25 LAB — LIPID PANEL
Chol/HDL Ratio: 3.6 ratio (ref 0.0–4.4)
Cholesterol, Total: 249 mg/dL — ABNORMAL HIGH (ref 100–199)
HDL: 69 mg/dL (ref >39)
LDL Calculated: 156 mg/dL — ABNORMAL HIGH (ref 0–99)
Triglycerides: 121 mg/dL (ref 0–149)
VLDL Cholesterol Cal: 24 mg/dL (ref 5–40)

## 2019-06-25 LAB — CBC WITH DIFFERENTIAL/PLATELET
Basophils Absolute: 0.1 10*3/uL (ref 0.0–0.2)
Basos: 1 %
EOS (ABSOLUTE): 0.1 10*3/uL (ref 0.0–0.4)
Eos: 2 %
Hematocrit: 39.7 % (ref 34.0–46.6)
Hemoglobin: 13.7 g/dL (ref 11.1–15.9)
Immature Grans (Abs): 0 10*3/uL (ref 0.0–0.1)
Immature Granulocytes: 0 %
Lymphocytes Absolute: 3.4 10*3/uL — ABNORMAL HIGH (ref 0.7–3.1)
Lymphs: 41 %
MCH: 31.7 pg (ref 26.6–33.0)
MCHC: 34.5 g/dL (ref 31.5–35.7)
MCV: 92 fL (ref 79–97)
Monocytes Absolute: 0.9 10*3/uL (ref 0.1–0.9)
Monocytes: 11 %
Neutrophils Absolute: 3.9 10*3/uL (ref 1.4–7.0)
Neutrophils: 45 %
Platelets: 258 10*3/uL (ref 150–450)
RBC: 4.32 x10E6/uL (ref 3.77–5.28)
RDW: 13 % (ref 11.7–15.4)
WBC: 8.4 10*3/uL (ref 3.4–10.8)

## 2019-06-25 LAB — TSH: TSH: 1.21 u[IU]/mL (ref 0.450–4.500)

## 2019-06-25 NOTE — Addendum Note (Signed)
Addended by: Shelbie Ammons on: 06/25/2019 08:11 AM   Modules accepted: Orders

## 2019-09-17 DIAGNOSIS — Z124 Encounter for screening for malignant neoplasm of cervix: Secondary | ICD-10-CM | POA: Diagnosis not present

## 2019-09-17 DIAGNOSIS — Z3201 Encounter for pregnancy test, result positive: Secondary | ICD-10-CM | POA: Diagnosis not present

## 2019-09-17 DIAGNOSIS — N925 Other specified irregular menstruation: Secondary | ICD-10-CM | POA: Diagnosis not present

## 2019-10-13 DIAGNOSIS — Z3490 Encounter for supervision of normal pregnancy, unspecified, unspecified trimester: Secondary | ICD-10-CM | POA: Insufficient documentation

## 2019-10-22 DIAGNOSIS — Z3480 Encounter for supervision of other normal pregnancy, unspecified trimester: Secondary | ICD-10-CM | POA: Diagnosis not present

## 2019-10-22 DIAGNOSIS — Z3682 Encounter for antenatal screening for nuchal translucency: Secondary | ICD-10-CM | POA: Diagnosis not present

## 2019-10-22 LAB — OB RESULTS CONSOLE GC/CHLAMYDIA
Chlamydia: NEGATIVE
Gonorrhea: NEGATIVE

## 2019-10-22 LAB — OB RESULTS CONSOLE HEPATITIS B SURFACE ANTIGEN: Hepatitis B Surface Ag: NEGATIVE

## 2019-10-22 LAB — OB RESULTS CONSOLE ABO/RH: RH Type: POSITIVE

## 2019-10-22 LAB — OB RESULTS CONSOLE RPR: RPR: NONREACTIVE

## 2019-10-22 LAB — OB RESULTS CONSOLE RUBELLA ANTIBODY, IGM: Rubella: IMMUNE

## 2019-10-22 LAB — OB RESULTS CONSOLE ANTIBODY SCREEN: Antibody Screen: NEGATIVE

## 2019-10-22 LAB — OB RESULTS CONSOLE HIV ANTIBODY (ROUTINE TESTING): HIV: NONREACTIVE

## 2019-11-18 DIAGNOSIS — Z369 Encounter for antenatal screening, unspecified: Secondary | ICD-10-CM | POA: Diagnosis not present

## 2019-12-20 DIAGNOSIS — M5489 Other dorsalgia: Secondary | ICD-10-CM | POA: Diagnosis not present

## 2019-12-23 DIAGNOSIS — Z363 Encounter for antenatal screening for malformations: Secondary | ICD-10-CM | POA: Diagnosis not present

## 2020-01-21 DIAGNOSIS — Z369 Encounter for antenatal screening, unspecified: Secondary | ICD-10-CM | POA: Diagnosis not present

## 2020-02-01 ENCOUNTER — Encounter: Payer: Self-pay | Admitting: Family

## 2020-02-01 ENCOUNTER — Other Ambulatory Visit: Payer: Self-pay

## 2020-02-01 ENCOUNTER — Ambulatory Visit: Payer: 59 | Admitting: Family

## 2020-02-01 VITALS — BP 125/76 | HR 97 | Temp 98.4°F | Ht 68.0 in | Wt 234.6 lb

## 2020-02-01 DIAGNOSIS — L0291 Cutaneous abscess, unspecified: Secondary | ICD-10-CM

## 2020-02-01 DIAGNOSIS — Z3A26 26 weeks gestation of pregnancy: Secondary | ICD-10-CM

## 2020-02-01 MED ORDER — AMOXICILLIN-POT CLAVULANATE 875-125 MG PO TABS: 1.0000 | ORAL_TABLET | Freq: Two times a day (BID) | ORAL | 0 refills | Status: DC

## 2020-02-01 NOTE — Progress Notes (Signed)
   Subjective:    Patient ID: Bonnie Hughes, female    DOB: August 19, 1991, 29 y.o.   MRN: [id number]  Chief Complaint  Patient presents with  . Recurrent Skin Infections    top of buttocks    HPI PT presents to the office today with an abscess on buttocks that is more on the left that she noticed 4 days ago that has worsen. She reports the area is hard, red, tender, and mild heat. She denies any discharges. She has tried warm compresses with mild relief.   She is [redacted] weeks pregnant.    Review of Systems  All other systems reviewed and are negative.      Objective:   Physical Exam Vitals reviewed.  Constitutional:      General: She is not in acute distress.    Appearance: She is well-developed.  HENT:     Head: Normocephalic and atraumatic.  Eyes:     Pupils: Pupils are equal, round, and reactive to light.  Neck:     Thyroid: No thyromegaly.  Cardiovascular:     Rate and Rhythm: Normal rate and regular rhythm.     Heart sounds: Normal heart sounds. No murmur.  Pulmonary:     Effort: Pulmonary effort is normal. No respiratory distress.     Breath sounds: Normal breath sounds. No wheezing.  Abdominal:     General: Bowel sounds are normal. There is no distension.     Palpations: Abdomen is soft.     Tenderness: There is no abdominal tenderness.  Musculoskeletal:        General: No tenderness. Normal range of motion.     Cervical back: Normal range of motion and neck supple.  Skin:    General: Skin is warm and dry.          Comments: Hard, erythemas abscess on inner left  gluteal fold  Neurological:     Mental Status: She is alert and oriented to person, place, and time.     Cranial Nerves: No cranial nerve deficit.     Deep Tendon Reflexes: Reflexes are normal and symmetric.  Psychiatric:        Behavior: Behavior normal.        Thought Content: Thought content normal.        Judgment: Judgment normal.      BP 125/76   Pulse 97   Temp 98.4 F (36.9 C)  (Temporal)   Ht 5\' 8"  (1.727 m)   Wt 234 lb 9.6 oz (106.4 kg)   SpO2 96%   BMI 35.67 kg/m       Assessment & Plan:  Bonnie Hughes comes in today with chief complaint of Recurrent Skin Infections (top of buttocks)   Diagnosis and orders addressed:  1. Abscess Warm compression  Hot soaks Start Augmentin (safe for pregnancy) RTO in 7 days if area does not improve  - amoxicillin-clavulanate (AUGMENTIN) 875-125 MG tablet; Take 1 tablet by mouth 2 (two) times daily.  Dispense: 14 tablet; Refill: 0  2. [redacted] weeks gestation of pregnancy  Evelina Dun, FNP

## 2020-02-01 NOTE — Patient Instructions (Signed)
Skin Abscess  A skin abscess is an infected area on or under your skin that contains a collection of pus and other material. An abscess may also be called a furuncle, carbuncle, or boil. An abscess can occur in or on almost any part of your body. Some abscesses break open (rupture) on their own. Most continue to get worse unless they are treated. The infection can spread deeper into the body and eventually into your blood, which can make you feel ill. Treatment usually involves draining the abscess. What are the causes? An abscess occurs when germs, like bacteria, pass through your skin and cause an infection. This may be caused by:  A scrape or cut on your skin.  A puncture wound through your skin, including a needle injection or insect bite.  Blocked oil or sweat glands.  Blocked and infected hair follicles.  A cyst that forms beneath your skin (sebaceous cyst) and becomes infected. What increases the risk? This condition is more likely to develop in people who:  Have a weak body defense system (immune system).  Have diabetes.  Have dry and irritated skin.  Get frequent injections or use illegal IV drugs.  Have a foreign body in a wound, such as a splinter.  Have problems with their lymph system or veins. What are the signs or symptoms? Symptoms of this condition include:  A painful, firm bump under the skin.  A bump with pus at the top. This may break through the skin and drain. Other symptoms include:  Redness surrounding the abscess site.  Warmth.  Swelling of the lymph nodes (glands) near the abscess.  Tenderness.  A sore on the skin. How is this diagnosed? This condition may be diagnosed based on:  A physical exam.  Your medical history.  A sample of pus. This may be used to find out what is causing the infection.  Blood tests.  Imaging tests, such as an ultrasound, CT scan, or MRI. How is this treated? A small abscess that drains on its own may  not need treatment. Treatment for larger abscesses may include:  Moist heat or heat pack applied to the area several times a day.  A procedure to drain the abscess (incision and drainage).  Antibiotic medicines. For a severe abscess, you may first get antibiotics through an IV and then change to antibiotics by mouth. Follow these instructions at home: Medicines   Take over-the-counter and prescription medicines only as told by your health care provider.  If you were prescribed an antibiotic medicine, take it as told by your health care provider. Do not stop taking the antibiotic even if you start to feel better. Abscess care   If you have an abscess that has not drained, apply heat to the affected area. Use the heat source that your health care provider recommends, such as a moist heat pack or a heating pad. ? Place a towel between your skin and the heat source. ? Leave the heat on for 20-30 minutes. ? Remove the heat if your skin turns bright red. This is especially important if you are unable to feel pain, heat, or cold. You may have a greater risk of getting burned.  Follow instructions from your health care provider about how to take care of your abscess. Make sure you: ? Cover the abscess with a bandage (dressing). ? Change your dressing or gauze as told by your health care provider. ? Wash your hands with soap and water before you change the   dressing or gauze. If soap and water are not available, use hand sanitizer.  Check your abscess every day for signs of a worsening infection. Check for: ? More redness, swelling, or pain. ? More fluid or blood. ? Warmth. ? More pus or a bad smell. General instructions  To avoid spreading the infection: ? Do not share personal care items, towels, or hot tubs with others. ? Avoid making skin contact with other people.  Keep all follow-up visits as told by your health care provider. This is important. Contact a health care provider if  you have:  More redness, swelling, or pain around your abscess.  More fluid or blood coming from your abscess.  Warm skin around your abscess.  More pus or a bad smell coming from your abscess.  A fever.  Muscle aches.  Chills or a general ill feeling. Get help right away if you:  Have severe pain.  See red streaks on your skin spreading away from the abscess. Summary  A skin abscess is an infected area on or under your skin that contains a collection of pus and other material.  A small abscess that drains on its own may not need treatment.  Treatment for larger abscesses may include having a procedure to drain the abscess and taking an antibiotic. This information is not intended to replace advice given to you by your health care provider. Make sure you discuss any questions you have with your health care provider. Document Revised: 03/18/2019 Document Reviewed: 01/08/2018 Elsevier Patient Education  2020 Elsevier Inc.  

## 2020-02-17 DIAGNOSIS — Z23 Encounter for immunization: Secondary | ICD-10-CM | POA: Diagnosis not present

## 2020-02-17 DIAGNOSIS — Z3480 Encounter for supervision of other normal pregnancy, unspecified trimester: Secondary | ICD-10-CM | POA: Diagnosis not present

## 2020-02-23 ENCOUNTER — Other Ambulatory Visit: Payer: Self-pay

## 2020-02-23 ENCOUNTER — Encounter: Payer: 59 | Attending: Obstetrics and Gynecology | Admitting: Registered"

## 2020-02-23 DIAGNOSIS — O99810 Abnormal glucose complicating pregnancy: Secondary | ICD-10-CM | POA: Insufficient documentation

## 2020-03-02 DIAGNOSIS — Z369 Encounter for antenatal screening, unspecified: Secondary | ICD-10-CM | POA: Diagnosis not present

## 2020-03-04 DIAGNOSIS — O99810 Abnormal glucose complicating pregnancy: Secondary | ICD-10-CM

## 2020-03-04 HISTORY — DX: Abnormal glucose complicating pregnancy: O99.810

## 2020-03-04 NOTE — Progress Notes (Signed)
Patient was seen on 02/23/20 for Gestational Diabetes self-management class at the [facility]. The following learning objectives were met by the patient during this course:   States the definition of Gestational Diabetes  States why dietary management is important in controlling blood glucose  Describes the effects each nutrient has on blood glucose levels  Demonstrates ability to create a balanced meal plan  Demonstrates carbohydrate counting   States when to check blood glucose levels  Demonstrates proper blood glucose monitoring techniques  States the effect of stress and exercise on blood glucose levels  States the importance of limiting caffeine and abstaining from alcohol and smoking  Blood glucose monitor given: none.   [employer] Employees with [facility] are require to have a Rx for Freestyle Lite meter, strips and lancets be sent to a [place].  Patient/Employee was provided a Psychologist, prison and probation services during class to get familiar with what she will be using but did not prick finger to get a blood glucose reading. It is against [place] policy to let patients prick their fingers using lancing device that may have been used by another person. Employee was instructed to ask pharmacist for further instruction if needs additional help using glucometer.  Patient instructed to monitor glucose levels: FBS: 60 - <95; 1 hour: <140; 2 hour: <120  Patient received handouts:  Nutrition Diabetes and Pregnancy, including carb counting list  Patient will be seen for follow-up as needed.

## 2020-03-09 DIAGNOSIS — Z369 Encounter for antenatal screening, unspecified: Secondary | ICD-10-CM | POA: Diagnosis not present

## 2020-03-15 DIAGNOSIS — Z3403 Encounter for supervision of normal first pregnancy, third trimester: Secondary | ICD-10-CM | POA: Diagnosis not present

## 2020-03-20 DIAGNOSIS — O24419 Gestational diabetes mellitus in pregnancy, unspecified control: Secondary | ICD-10-CM | POA: Diagnosis not present

## 2020-03-21 DIAGNOSIS — Z3482 Encounter for supervision of other normal pregnancy, second trimester: Secondary | ICD-10-CM | POA: Diagnosis not present

## 2020-03-21 DIAGNOSIS — Z3483 Encounter for supervision of other normal pregnancy, third trimester: Secondary | ICD-10-CM | POA: Diagnosis not present

## 2020-03-25 DIAGNOSIS — L0231 Cutaneous abscess of buttock: Secondary | ICD-10-CM | POA: Diagnosis not present

## 2020-03-28 ENCOUNTER — Ambulatory Visit: Payer: 59 | Admitting: Family Medicine

## 2020-03-30 DIAGNOSIS — O24419 Gestational diabetes mellitus in pregnancy, unspecified control: Secondary | ICD-10-CM | POA: Diagnosis not present

## 2020-04-03 DIAGNOSIS — O24415 Gestational diabetes mellitus in pregnancy, controlled by oral hypoglycemic drugs: Secondary | ICD-10-CM | POA: Diagnosis not present

## 2020-04-06 DIAGNOSIS — O24419 Gestational diabetes mellitus in pregnancy, unspecified control: Secondary | ICD-10-CM | POA: Diagnosis not present

## 2020-04-09 DIAGNOSIS — Z5189 Encounter for other specified aftercare: Secondary | ICD-10-CM | POA: Diagnosis not present

## 2020-04-09 DIAGNOSIS — L0231 Cutaneous abscess of buttock: Secondary | ICD-10-CM | POA: Diagnosis not present

## 2020-04-10 DIAGNOSIS — Z369 Encounter for antenatal screening, unspecified: Secondary | ICD-10-CM | POA: Diagnosis not present

## 2020-04-10 DIAGNOSIS — O24415 Gestational diabetes mellitus in pregnancy, controlled by oral hypoglycemic drugs: Secondary | ICD-10-CM | POA: Diagnosis not present

## 2020-04-10 DIAGNOSIS — Z3480 Encounter for supervision of other normal pregnancy, unspecified trimester: Secondary | ICD-10-CM | POA: Diagnosis not present

## 2020-04-10 LAB — OB RESULTS CONSOLE GBS: GBS: NEGATIVE

## 2020-04-12 DIAGNOSIS — Z5189 Encounter for other specified aftercare: Secondary | ICD-10-CM | POA: Diagnosis not present

## 2020-04-18 ENCOUNTER — Encounter (HOSPITAL_COMMUNITY): Payer: Self-pay | Admitting: *Deleted

## 2020-04-18 ENCOUNTER — Encounter (HOSPITAL_COMMUNITY): Payer: Self-pay

## 2020-04-18 NOTE — Patient Instructions (Addendum)
Bonnie Hughes  04/18/2020   Your procedure is scheduled on:  04/28/2020  Arrive at 1000 at Entrance C on [address] at [facility]  and [employer]. You are invited to use the FREE valet parking or use the Visitor's parking deck.  Pick up the phone at the desk and dial [phone].  Call this number if you have problems the morning of surgery: [phone]  Remember:   Do not eat food:(After Midnight) Desps de medianoche.  Do not drink clear liquids: (After Midnight) Desps de medianoche.  Take these medicines the morning of surgery with A SIP OF WATER:  Take your glyburide as prescribed   Do not wear jewelry, make-up or nail polish.  Do not wear lotions, powders, or perfumes. Do not wear deodorant.  Do not shave 48 hours prior to surgery.  Do not bring valuables to the hospital.  [facility] is not   responsible for any belongings or valuables brought to the hospital.  Contacts, dentures or bridgework may not be worn into surgery.  Leave suitcase in the car. After surgery it may be brought to your room.  For patients admitted to the hospital, checkout time is 11:00 AM the day of              discharge.      Please read over the following fact sheets that you were given:     Preparing for Surgery

## 2020-04-20 DIAGNOSIS — O24415 Gestational diabetes mellitus in pregnancy, controlled by oral hypoglycemic drugs: Secondary | ICD-10-CM | POA: Diagnosis not present

## 2020-04-24 DIAGNOSIS — O24419 Gestational diabetes mellitus in pregnancy, unspecified control: Secondary | ICD-10-CM | POA: Diagnosis not present

## 2020-04-24 DIAGNOSIS — O133 Gestational [pregnancy-induced] hypertension without significant proteinuria, third trimester: Secondary | ICD-10-CM | POA: Diagnosis not present

## 2020-04-25 ENCOUNTER — Inpatient Hospital Stay (HOSPITAL_COMMUNITY)
Admission: AD | Admit: 2020-04-25 | Discharge: 2020-04-27 | DRG: 788 | Disposition: A | Discharge status: Home / Self Care | Payer: 59 | Attending: Obstetrics and Gynecology | Admitting: Obstetrics and Gynecology

## 2020-04-25 ENCOUNTER — Encounter (HOSPITAL_COMMUNITY): Payer: Self-pay | Admitting: Obstetrics and Gynecology

## 2020-04-25 ENCOUNTER — Inpatient Hospital Stay (HOSPITAL_COMMUNITY): Payer: 59 | Admitting: Certified Registered"

## 2020-04-25 ENCOUNTER — Encounter (HOSPITAL_COMMUNITY)
Admission: AD | Disposition: A | Discharge status: Home / Self Care | Payer: Self-pay | Source: Home / Self Care | Attending: Obstetrics and Gynecology

## 2020-04-25 ENCOUNTER — Other Ambulatory Visit: Payer: Self-pay

## 2020-04-25 DIAGNOSIS — O328XX0 Maternal care for other malpresentation of fetus, not applicable or unspecified: Principal | ICD-10-CM | POA: Diagnosis present

## 2020-04-25 DIAGNOSIS — O321XX1 Maternal care for breech presentation, fetus 1: Secondary | ICD-10-CM | POA: Diagnosis not present

## 2020-04-25 DIAGNOSIS — O24429 Gestational diabetes mellitus in childbirth, unspecified control: Secondary | ICD-10-CM | POA: Diagnosis present

## 2020-04-25 DIAGNOSIS — O24419 Gestational diabetes mellitus in pregnancy, unspecified control: Secondary | ICD-10-CM | POA: Diagnosis not present

## 2020-04-25 DIAGNOSIS — O321XX0 Maternal care for breech presentation, not applicable or unspecified: Secondary | ICD-10-CM

## 2020-04-25 DIAGNOSIS — Z3A38 38 weeks gestation of pregnancy: Secondary | ICD-10-CM | POA: Diagnosis not present

## 2020-04-25 DIAGNOSIS — Z20822 Contact with and (suspected) exposure to covid-19: Secondary | ICD-10-CM | POA: Diagnosis present

## 2020-04-25 DIAGNOSIS — Z3A00 Weeks of gestation of pregnancy not specified: Secondary | ICD-10-CM | POA: Diagnosis not present

## 2020-04-25 LAB — CBC
HCT: 37.2 % (ref 36.0–46.0)
Hemoglobin: 11.7 g/dL — ABNORMAL LOW (ref 12.0–15.0)
MCH: 28.8 pg (ref 26.0–34.0)
MCHC: 31.5 g/dL (ref 30.0–36.0)
MCV: 91.6 fL (ref 80.0–100.0)
Platelets: 231 10*3/uL (ref 150–400)
RBC: 4.06 MIL/uL (ref 3.87–5.11)
RDW: 16 % — ABNORMAL HIGH (ref 11.5–15.5)
WBC: 8.5 10*3/uL (ref 4.0–10.5)
nRBC: 0 % (ref 0.0–0.2)

## 2020-04-25 LAB — ABO/RH: ABO/RH(D): A POS

## 2020-04-25 LAB — SARS CORONAVIRUS 2 BY RT PCR (HOSPITAL ORDER, PERFORMED IN ~~LOC~~ HOSPITAL LAB): SARS Coronavirus 2: NEGATIVE

## 2020-04-25 LAB — POCT FERN TEST: POCT Fern Test: POSITIVE

## 2020-04-25 LAB — TYPE AND SCREEN
ABO/RH(D): A POS
Antibody Screen: NEGATIVE

## 2020-04-25 LAB — RPR: RPR Ser Ql: NONREACTIVE

## 2020-04-25 LAB — GLUCOSE, CAPILLARY: Glucose-Capillary: 75 mg/dL (ref 70–99)

## 2020-04-25 SURGERY — Surgical Case
Anesthesia: Spinal | Site: Abdomen | Wound class: Clean Contaminated

## 2020-04-25 MED ORDER — SODIUM CHLORIDE 0.9 % IR SOLN
Status: DC | PRN
  Administered 2020-04-25: 1000 mL

## 2020-04-25 MED ORDER — NALBUPHINE HCL 10 MG/ML IJ SOLN: 5.0000 mg | Freq: Once | INTRAMUSCULAR | Status: DC | PRN

## 2020-04-25 MED ORDER — NALBUPHINE HCL 10 MG/ML IJ SOLN: 5.0000 mg | INTRAMUSCULAR | Status: DC | PRN

## 2020-04-25 MED ORDER — MORPHINE SULFATE (PF) 0.5 MG/ML IJ SOLN
INTRAMUSCULAR | Status: DC | PRN
  Administered 2020-04-25: 150 ug via INTRATHECAL

## 2020-04-25 MED ORDER — ONDANSETRON HCL 4 MG/2ML IJ SOLN: 4.0000 mg | Freq: Three times a day (TID) | INTRAMUSCULAR | Status: DC | PRN

## 2020-04-25 MED ORDER — PROMETHAZINE HCL 25 MG/ML IJ SOLN: 6.2500 mg | INTRAMUSCULAR | Status: DC | PRN

## 2020-04-25 MED ORDER — PHENYLEPHRINE HCL-NACL 20-0.9 MG/250ML-% IV SOLN
INTRAVENOUS | Status: DC | PRN
  Administered 2020-04-25: 60 ug/min via INTRAVENOUS

## 2020-04-25 MED ORDER — LACTATED RINGERS IV SOLN: INTRAVENOUS | Status: DC

## 2020-04-25 MED ORDER — SIMETHICONE 80 MG PO CHEW: 80.0000 mg | CHEWABLE_TABLET | ORAL | Status: DC | PRN

## 2020-04-25 MED ORDER — SODIUM CHLORIDE 0.9% FLUSH: 3.0000 mL | INTRAVENOUS | Status: DC | PRN

## 2020-04-25 MED ORDER — KETOROLAC TROMETHAMINE 30 MG/ML IJ SOLN
30.0000 mg | Freq: Four times a day (QID) | INTRAMUSCULAR | Status: AC | PRN
  Administered 2020-04-25: 30 mg via INTRAMUSCULAR

## 2020-04-25 MED ORDER — SOD CITRATE-CITRIC ACID 500-334 MG/5ML PO SOLN
30.0000 mL | Freq: Once | ORAL | Status: AC
  Administered 2020-04-25: 30 mL via ORAL
  Filled 2020-04-25: qty 30

## 2020-04-25 MED ORDER — NALOXONE HCL 0.4 MG/ML IJ SOLN: 0.4000 mg | INTRAMUSCULAR | Status: DC | PRN

## 2020-04-25 MED ORDER — FENTANYL CITRATE (PF) 100 MCG/2ML IJ SOLN: 25.0000 ug | INTRAMUSCULAR | Status: DC | PRN

## 2020-04-25 MED ORDER — DEXAMETHASONE SODIUM PHOSPHATE 10 MG/ML IJ SOLN
INTRAMUSCULAR | Status: AC
  Filled 2020-04-25: qty 1

## 2020-04-25 MED ORDER — ONDANSETRON HCL 4 MG/2ML IJ SOLN
INTRAMUSCULAR | Status: DC | PRN
  Administered 2020-04-25: 4 mg via INTRAVENOUS

## 2020-04-25 MED ORDER — DIBUCAINE (PERIANAL) 1 % EX OINT: 1.0000 "application " | TOPICAL_OINTMENT | CUTANEOUS | Status: DC | PRN

## 2020-04-25 MED ORDER — MORPHINE SULFATE (PF) 0.5 MG/ML IJ SOLN
INTRAMUSCULAR | Status: AC
  Filled 2020-04-25: qty 10

## 2020-04-25 MED ORDER — NALOXONE HCL 4 MG/10ML IJ SOLN
1.0000 ug/kg/h | INTRAVENOUS | Status: DC | PRN
  Filled 2020-04-25: qty 5

## 2020-04-25 MED ORDER — OXYCODONE HCL 5 MG PO TABS: 5.0000 mg | ORAL_TABLET | ORAL | Status: DC | PRN

## 2020-04-25 MED ORDER — CEFAZOLIN SODIUM-DEXTROSE 2-4 GM/100ML-% IV SOLN
2.0000 g | INTRAVENOUS | Status: AC
  Administered 2020-04-25: 2 g via INTRAVENOUS

## 2020-04-25 MED ORDER — DIPHENHYDRAMINE HCL 50 MG/ML IJ SOLN: 12.5000 mg | INTRAMUSCULAR | Status: DC | PRN

## 2020-04-25 MED ORDER — PRENATAL MULTIVITAMIN CH
1.0000 | ORAL_TABLET | Freq: Every day | ORAL | Status: DC
  Administered 2020-04-25 – 2020-04-27 (×3): 1 via ORAL
  Filled 2020-04-25 (×3): qty 1

## 2020-04-25 MED ORDER — KETOROLAC TROMETHAMINE 30 MG/ML IJ SOLN
30.0000 mg | Freq: Four times a day (QID) | INTRAMUSCULAR | Status: AC | PRN
  Administered 2020-04-25 – 2020-04-26 (×2): 30 mg via INTRAVENOUS
  Filled 2020-04-25 (×2): qty 1

## 2020-04-25 MED ORDER — SIMETHICONE 80 MG PO CHEW
80.0000 mg | CHEWABLE_TABLET | Freq: Three times a day (TID) | ORAL | Status: DC
  Administered 2020-04-25 – 2020-04-27 (×6): 80 mg via ORAL
  Filled 2020-04-25 (×6): qty 1

## 2020-04-25 MED ORDER — ZOLPIDEM TARTRATE 5 MG PO TABS: 5.0000 mg | ORAL_TABLET | Freq: Every evening | ORAL | Status: DC | PRN

## 2020-04-25 MED ORDER — KETOROLAC TROMETHAMINE 30 MG/ML IJ SOLN
INTRAMUSCULAR | Status: AC
  Filled 2020-04-25: qty 1

## 2020-04-25 MED ORDER — ONDANSETRON HCL 4 MG/2ML IJ SOLN
INTRAMUSCULAR | Status: AC
  Filled 2020-04-25: qty 2

## 2020-04-25 MED ORDER — SIMETHICONE 80 MG PO CHEW
80.0000 mg | CHEWABLE_TABLET | ORAL | Status: DC
  Administered 2020-04-25 – 2020-04-26 (×2): 80 mg via ORAL
  Filled 2020-04-25 (×2): qty 1

## 2020-04-25 MED ORDER — MENTHOL 3 MG MT LOZG: 1.0000 | LOZENGE | OROMUCOSAL | Status: DC | PRN

## 2020-04-25 MED ORDER — PHENYLEPHRINE HCL-NACL 20-0.9 MG/250ML-% IV SOLN
INTRAVENOUS | Status: AC
  Filled 2020-04-25: qty 250

## 2020-04-25 MED ORDER — ACETAMINOPHEN 500 MG PO TABS: 1000.0000 mg | ORAL_TABLET | Freq: Once | ORAL | Status: DC

## 2020-04-25 MED ORDER — DEXAMETHASONE SODIUM PHOSPHATE 4 MG/ML IJ SOLN
INTRAMUSCULAR | Status: DC | PRN
  Administered 2020-04-25: 8 mg via INTRAVENOUS

## 2020-04-25 MED ORDER — CEFAZOLIN SODIUM-DEXTROSE 2-4 GM/100ML-% IV SOLN
INTRAVENOUS | Status: AC
  Filled 2020-04-25: qty 100

## 2020-04-25 MED ORDER — COCONUT OIL OIL
1.0000 "application " | TOPICAL_OIL | Status: DC | PRN
  Administered 2020-04-25: 1 via TOPICAL

## 2020-04-25 MED ORDER — BUPIVACAINE IN DEXTROSE 0.75-8.25 % IT SOLN
INTRATHECAL | Status: DC | PRN
  Administered 2020-04-25: 1.6 mL via INTRATHECAL

## 2020-04-25 MED ORDER — WITCH HAZEL-GLYCERIN EX PADS: 1.0000 "application " | MEDICATED_PAD | CUTANEOUS | Status: DC | PRN

## 2020-04-25 MED ORDER — OXYTOCIN 40 UNITS IN NORMAL SALINE INFUSION - SIMPLE MED
INTRAVENOUS | Status: AC
  Filled 2020-04-25: qty 1000

## 2020-04-25 MED ORDER — ACETAMINOPHEN 325 MG PO TABS
650.0000 mg | ORAL_TABLET | ORAL | Status: DC | PRN
  Administered 2020-04-26 (×2): 650 mg via ORAL
  Filled 2020-04-25 (×2): qty 2

## 2020-04-25 MED ORDER — OXYTOCIN 40 UNITS IN NORMAL SALINE INFUSION - SIMPLE MED: INTRAVENOUS | Status: DC | PRN

## 2020-04-25 MED ORDER — FAMOTIDINE IN NACL 20-0.9 MG/50ML-% IV SOLN
20.0000 mg | Freq: Once | INTRAVENOUS | Status: AC
  Administered 2020-04-25: 20 mg via INTRAVENOUS
  Filled 2020-04-25: qty 50

## 2020-04-25 MED ORDER — STERILE WATER FOR IRRIGATION IR SOLN
Status: DC | PRN
  Administered 2020-04-25: 1000 mL

## 2020-04-25 MED ORDER — LACTATED RINGERS IV BOLUS
1000.0000 mL | Freq: Once | INTRAVENOUS | Status: AC
  Administered 2020-04-25: 1000 mL via INTRAVENOUS

## 2020-04-25 MED ORDER — ACETAMINOPHEN 500 MG PO TABS
1000.0000 mg | ORAL_TABLET | Freq: Four times a day (QID) | ORAL | Status: AC
  Administered 2020-04-25 (×3): 1000 mg via ORAL
  Filled 2020-04-25 (×3): qty 2

## 2020-04-25 MED ORDER — ACETAMINOPHEN 160 MG/5ML PO SOLN: 1000.0000 mg | Freq: Once | ORAL | Status: DC

## 2020-04-25 MED ORDER — FENTANYL CITRATE (PF) 100 MCG/2ML IJ SOLN
INTRAMUSCULAR | Status: DC | PRN
  Administered 2020-04-25: 15 ug via INTRATHECAL

## 2020-04-25 MED ORDER — OXYTOCIN 40 UNITS IN NORMAL SALINE INFUSION - SIMPLE MED
INTRAVENOUS | Status: DC | PRN
  Administered 2020-04-25: 300 mL via INTRAVENOUS

## 2020-04-25 MED ORDER — TETANUS-DIPHTH-ACELL PERTUSSIS 5-2.5-18.5 LF-MCG/0.5 IM SUSP: 0.5000 mL | Freq: Once | INTRAMUSCULAR | Status: DC

## 2020-04-25 MED ORDER — OXYTOCIN 40 UNITS IN NORMAL SALINE INFUSION - SIMPLE MED: 2.5000 [IU]/h | INTRAVENOUS | Status: AC

## 2020-04-25 MED ORDER — KETOROLAC TROMETHAMINE 30 MG/ML IJ SOLN: 30.0000 mg | Freq: Once | INTRAMUSCULAR | Status: DC

## 2020-04-25 MED ORDER — SENNOSIDES-DOCUSATE SODIUM 8.6-50 MG PO TABS
2.0000 | ORAL_TABLET | ORAL | Status: DC
  Administered 2020-04-25 – 2020-04-26 (×2): 2 via ORAL
  Filled 2020-04-25 (×2): qty 2

## 2020-04-25 MED ORDER — FENTANYL CITRATE (PF) 100 MCG/2ML IJ SOLN
INTRAMUSCULAR | Status: AC
  Filled 2020-04-25: qty 2

## 2020-04-25 MED ORDER — DIPHENHYDRAMINE HCL 25 MG PO CAPS: 25.0000 mg | ORAL_CAPSULE | ORAL | Status: DC | PRN

## 2020-04-25 SURGICAL SUPPLY — 34 items
BENZOIN TINCTURE PRP APPL 2/3 (GAUZE/BANDAGES/DRESSINGS) ×2 IMPLANT
CHLORAPREP W/TINT 26ML (MISCELLANEOUS) ×2 IMPLANT
CLAMP CORD UMBIL (MISCELLANEOUS) ×2 IMPLANT
CLOTH BEACON ORANGE TIMEOUT ST (SAFETY) ×2 IMPLANT
DERMABOND ADVANCED (GAUZE/BANDAGES/DRESSINGS) ×1
DERMABOND ADVANCED .7 DNX12 (GAUZE/BANDAGES/DRESSINGS) ×1 IMPLANT
DRSG OPSITE POSTOP 4X10 (GAUZE/BANDAGES/DRESSINGS) ×2 IMPLANT
ELECT REM PT RETURN 9FT ADLT (ELECTROSURGICAL) ×2
ELECTRODE REM PT RTRN 9FT ADLT (ELECTROSURGICAL) ×1 IMPLANT
GLOVE BIOGEL PI IND STRL 6.5 (GLOVE) ×1 IMPLANT
GLOVE BIOGEL PI IND STRL 7.0 (GLOVE) ×1 IMPLANT
GLOVE BIOGEL PI INDICATOR 6.5 (GLOVE) ×1
GLOVE BIOGEL PI INDICATOR 7.0 (GLOVE) ×1
GLOVE ECLIPSE 6.0 STRL STRAW (GLOVE) ×2 IMPLANT
GOWN STRL REUS W/TWL LRG LVL3 (GOWN DISPOSABLE) ×4 IMPLANT
NS IRRIG 1000ML POUR BTL (IV SOLUTION) ×2 IMPLANT
PACK C SECTION WH (CUSTOM PROCEDURE TRAY) ×2 IMPLANT
PAD OB MATERNITY 4.3X12.25 (PERSONAL CARE ITEMS) ×2 IMPLANT
PENCIL SMOKE EVAC W/HOLSTER (ELECTROSURGICAL) ×2 IMPLANT
RTRCTR C-SECT PINK 25CM LRG (MISCELLANEOUS) ×2 IMPLANT
STRIP CLOSURE SKIN 1/2X4 (GAUZE/BANDAGES/DRESSINGS) ×2 IMPLANT
SUT MNCRL 0 VIOLET CTX 36 (SUTURE) ×2 IMPLANT
SUT MONOCRYL 0 CTX 36 (SUTURE) ×4
SUT PLAIN 2 0 (SUTURE) ×2
SUT PLAIN 2 0 XLH (SUTURE) ×2 IMPLANT
SUT PLAIN ABS 2-0 CT1 27XMFL (SUTURE) ×1 IMPLANT
SUT VIC AB 0 CTX 36 (SUTURE) ×4
SUT VIC AB 0 CTX36XBRD ANBCTRL (SUTURE) ×2 IMPLANT
SUT VIC AB 2-0 CT1 27 (SUTURE) ×2
SUT VIC AB 2-0 CT1 TAPERPNT 27 (SUTURE) ×1 IMPLANT
SUT VICRYL 4-0 PS2 18IN ABS (SUTURE) ×2 IMPLANT
TOWEL OR 17X24 6PK STRL BLUE (TOWEL DISPOSABLE) ×2 IMPLANT
TRAY FOLEY W/BAG SLVR 14FR LF (SET/KITS/TRAYS/PACK) ×2 IMPLANT
WATER STERILE IRR 1000ML POUR (IV SOLUTION) ×2 IMPLANT

## 2020-04-25 NOTE — Lactation Note (Signed)
This note was copied from a baby's chart. Lactation Consultation Note  Patient Name: Bonnie Hughes [id number] Date: 04/25/2020 Reason for consult: Initial assessment;Early term 37-38.6wks;Primapara;Maternal endocrine disorder Type of Endocrine Disorder?: Diabetes Baby is 5 hours old.  Baby has been to breast twice.  She is showing early feeding cues.  Assisted with positioning baby in football hold on left.  Taught hand expression.  No colostrum seen from left but easily expressed from right.  After a few attempts baby latched well.  Active sucking with stimulation but no swallows noted.  After 10 minutes baby came off and then latched to opposite breast.  Baby became sleepy after a few minutes.  Mom holding baby skin to skin.  Questions answered.  Breastfeeding consultation services information given and reviewed.  Maternal Data Has patient been taught Hand Expression?: Yes Does the patient have breastfeeding experience prior to this delivery?: No  Feeding Feeding Type: Breast Fed  LATCH Score Latch: Grasps breast easily, tongue down, lips flanged, rhythmical sucking.  Audible Swallowing: None  Type of Nipple: Everted at rest and after stimulation  Comfort (Breast/Nipple): Soft / non-tender  Hold (Positioning): Assistance needed to correctly position infant at breast and maintain latch.  LATCH Score: 7  Interventions Interventions: Breast feeding basics reviewed;Breast compression;Assisted with latch;Adjust position;Skin to skin;Support pillows;Breast massage;Hand express  Lactation Tools Discussed/Used     Consult Status Consult Status: Follow-up Date: 04/26/20 Follow-up type: In-patient    Ave Filter 04/25/2020, 2:00 PM

## 2020-04-25 NOTE — H&P (Signed)
Bonnie Hughes is a 29 y.o. female presenting for leaking fluid  29 yo G1P0 @ 38+4 presents for leaking fluid and is confirmed ruptured. She is scheduled for a primary cesarean section for breech. Her pregnancy hs been otherwise uncomplicated OB History    Gravida  1   Para      Term      Preterm      AB      Living        SAB      TAB      Ectopic      Multiple      Live Births             Past Medical History:  Diagnosis Date  . Gestational diabetes   . Heart murmur   . Vaginal Pap smear, abnormal    Past Surgical History:  Procedure Laterality Date  . COLPOSCOPY     Family History: family history includes Anemia in her maternal grandmother; Glaucoma in her paternal grandmother; Heart disease in her maternal grandmother and paternal grandmother; Hyperlipidemia in her father and paternal grandmother; Hypertension in her father and paternal grandmother. Social History:  reports that she has never smoked. She has never used smokeless tobacco. She reports that she does not drink alcohol or use drugs.     Maternal Diabetes: No Genetic Screening: Normal Maternal Ultrasounds/Referrals: Normal Fetal Ultrasounds or other Referrals:  None Maternal Substance Abuse:  No Significant Maternal Medications:  None Significant Maternal Lab Results:  None Other Comments:  None  Review of Systems History   Blood pressure 126/72, pulse 82, temperature 98.6 F (37 C), temperature source Oral, resp. rate 16, height 5\' 8"  (1.727 m), weight 113.9 kg, last menstrual period 07/30/2019, SpO2 98 %. Exam Physical Exam  Prenatal labs: ABO, Rh: --/--/A POS, A POS Performed at [facility], [place] [address] [place], [place] [address]  [phone]) Antibody: NEG (05/18 0550) Rubella: Immune (11/13 0000) RPR: Nonreactive (11/13 0000)  HBsAg: Negative (11/13 0000)  HIV: Non-reactive (11/13 0000)  GBS: Negative/-- (05/03 0000)   Assessment/Plan: 1) Admit 2) SCDs 3) Ancef  OCTOR 4) Proceed with c/s   Vanessa Kick 04/25/2020, 7:01 AM

## 2020-04-25 NOTE — Anesthesia Preprocedure Evaluation (Addendum)
Anesthesia Evaluation  Patient identified by MRN, date of birth, ID band Patient awake    Reviewed: Allergy & Precautions, NPO status , Patient's Chart, lab work & pertinent test results  History of Anesthesia Complications Negative for: history of anesthetic complications  Airway Mallampati: II  TM Distance: >3 FB Neck ROM: Full    Dental no notable dental hx.    Pulmonary neg pulmonary ROS,    Pulmonary exam normal        Cardiovascular negative cardio ROS Normal cardiovascular exam     Neuro/Psych  Headaches, negative psych ROS   GI/Hepatic negative GI ROS, Neg liver ROS,   Endo/Other  diabetes, Gestational  Renal/GU negative Renal ROS  negative genitourinary   Musculoskeletal negative musculoskeletal ROS (+)   Abdominal   Peds  Hematology negative hematology ROS (+)   Anesthesia Other Findings Day of surgery medications reviewed with patient.  Reproductive/Obstetrics (+) Pregnancy (breech presentation)                            Anesthesia Physical Anesthesia Plan  ASA: II  Anesthesia Plan: Spinal   Post-op Pain Management:    Induction:   PONV Risk Score and Plan: 4 or greater and Treatment may vary due to age or medical condition, Ondansetron and Dexamethasone  Airway Management Planned: Natural Airway  Additional Equipment:   Intra-op Plan:   Post-operative Plan:   Informed Consent: I have reviewed the patients History and Physical, chart, labs and discussed the procedure including the risks, benefits and alternatives for the proposed anesthesia with the patient or authorized representative who has indicated his/her understanding and acceptance.       Plan Discussed with: CRNA  Anesthesia Plan Comments:        Anesthesia Quick Evaluation

## 2020-04-25 NOTE — MAU Note (Signed)
Got up to pee at 0330, felt a pop while sitting on toilet then cloudy fluid came out and more came out, put on a depends.  Mild contractions.  Scheduled for C/S on Friday because baby is breech. No bleeding. Baby moving well.

## 2020-04-25 NOTE — Transfer of Care (Signed)
Immediate Anesthesia Transfer of Care Note  Patient: Bonnie Hughes  Procedure(s) Performed: CESAREAN SECTION (N/A Abdomen)  Patient Location: PACU  Anesthesia Type:Spinal  Level of Consciousness: awake, alert  and patient cooperative  Airway & Oxygen Therapy: Patient Spontanous Breathing  Post-op Assessment: Report given to RN and Post -op Vital signs reviewed and stable  Post vital signs: Reviewed and stable  Last Vitals:  Vitals Value Taken Time  BP    Temp    Pulse 72 04/25/20 0850  Resp 11 04/25/20 0850  SpO2 99 % 04/25/20 0850  Vitals shown include unvalidated device data.  Last Pain:  Vitals:   04/25/20 0519  TempSrc: Oral  PainSc:       Patients Stated Pain Goal: 0 ([id number])  Complications: No apparent anesthesia complications  SEE PACU FLOW SHEET FOR VITAL SIGNS

## 2020-04-25 NOTE — Op Note (Signed)
Pre-Operative Diagnosis: 1) 38+4 week intrauterine pregnancy 2) Spontaneous ruptture of membranes 3) Frank breech presentation 2) A2 Gestational diabetes Postoperative Diagnosis: 1) 38+4 week intrauterine pregnancy 2) Spontaneous ruptture of membranes 3) Frank breech presentation 2) A2 Gestational diabetes Procedure: Primary low transverse cesarean section Surgeon: Dr. Vanessa Kick Assistant: Dr. Bobbye Charleston Operative Findings: Vigorous female infant in the frank breech presentation converted to single footling. Apgars of 8 & 9. Weight 9#15 Specimen: placenta to L&D EBL: Total I/O In: 1100 [I.V.:1000; IV Piggyback:100] Out: [id number] [Urine:600; Blood:373]   Procedure:Bonnie Hughes is an 29 year old gravida 1 para 0 at 59 weeks and 4 days estimated gestational age who presents for cesarean section. Following the appropriate informed consent the patient was brought to the operating room where spinal anesthesia was administered and found to be adequate. She was placed in the dorsal supine position with a leftward tilt. She was prepped and draped in the normal sterile fashion. The patient was appropriately identified during a preoperative time out procedure. Scalpel was then used to make a Pfannenstiel skin incision which was carried down to the underlying layers of soft tissue to the fascia. The fascia was incised in the midline and the fascial incision was extended laterally with Mayo scissors. The superior aspect of the fascial incision was grasped with Coker clamps x2, tented up and the rectus muscles dissected off sharply with the electrocautery unit area and the same procedure was repeated on the inferior aspect of the fascial incision. The rectus muscles were separated in the midline. The abdominal peritoneum was identified, tented up, entered sharply, and the incision was extended superiorly and inferiorly with good visualization of the bladder. The Alexis retractor was then deployed. The vesicouterine  peritoneum was identified, tented up, entered sharply, and the bladder flap was created digitally. Scalpel was then used to make a low transverse incision on the uterus which was extended laterally with blunt dissection. The fetal frank breech was grasped and attempted to be delivered through the incision. This was unsuccessful therefore the breech was converted to single footling and delivered. The infant initially appeared stunned. A 1 minute delay in cord clamping was not performed due to this.The infant was bulb suctioned and passed to the neonatology team. Placenta was manually extracted due to partial umbilical cord avulsion. The uterine incision was repaired with #1 chromic in running locked fashion followed by a second imbricating layer. Ovaries and tubes were inspected and normal. The Alexis retractor was removed. The uterus was returned to the abdominal cavity the abdominal cavity was cleared of all clot and debris. The abdominal peritoneum was reapproximated with 2-0 Vicryl in a running fashion, the rectus muscles was reapproximated with 2-0 chromic in a running fashion. The fascia was closed with a looped PDS in a running fashion. The skin was closed with 4-0 vicryl in a subcuticular fashion and Dermabond. All sponge lap and needle counts were correct. Patient tolerated the procedure well and recovered in stable condition following the procedure.

## 2020-04-25 NOTE — Anesthesia Postprocedure Evaluation (Signed)
Anesthesia Post Note  Patient: Bonnie Hughes  Procedure(s) Performed: CESAREAN SECTION (N/A Abdomen)     Patient location during evaluation: PACU Anesthesia Type: Spinal Level of consciousness: awake and alert Pain management: pain level controlled Vital Signs Assessment: post-procedure vital signs reviewed and stable Respiratory status: spontaneous breathing, nonlabored ventilation and respiratory function stable Cardiovascular status: blood pressure returned to baseline and stable Postop Assessment: no apparent nausea or vomiting Anesthetic complications: no    Last Vitals:  Vitals:   04/25/20 0945 04/25/20 1000  BP: 117/78 110/82  Pulse: 63 63  Resp: 20 20  Temp:    SpO2: 99% 100%    Last Pain:  Vitals:   04/25/20 1000  TempSrc:   PainSc: 0-No pain   Pain Goal: Patients Stated Pain Goal: 0 (04/25/20 0511)  LLE Motor Response: Purposeful movement (04/25/20 1000)   RLE Motor Response: Purposeful movement (04/25/20 1000)       Epidural/Spinal Function Cutaneous sensation: Normal sensation (04/25/20 1000), Patient able to flex knees: Yes (04/25/20 1000), Patient able to lift hips off bed: Yes (04/25/20 1000), Back pain beyond tenderness at insertion site: No (04/25/20 1000), Progressively worsening motor and/or sensory loss: No (04/25/20 1000), Bowel and/or bladder incontinence post epidural: No (04/25/20 1000)  Lynda Rainwater

## 2020-04-25 NOTE — Anesthesia Procedure Notes (Signed)
Spinal  Patient location during procedure: OB Start time: 04/25/2020 7:38 AM End time: 04/25/2020 7:43 AM Staffing Performed: anesthesiologist  Anesthesiologist: Lynda Rainwater, MD Preanesthetic Checklist Completed: patient identified, IV checked, risks and benefits discussed, surgical consent, monitors and equipment checked, pre-op evaluation and timeout performed Spinal Block Patient position: sitting Prep: DuraPrep and site prepped and draped Patient monitoring: heart rate, cardiac monitor, continuous pulse ox and blood pressure Approach: midline Location: L3-4 Injection technique: single-shot Needle Needle type: Pencan  Needle gauge: 24 G Needle length: 10 cm Assessment Sensory level: T4

## 2020-04-25 NOTE — Progress Notes (Signed)
Introduced self to patient  Patient reports being scheduled for pLTCS d/t breech presentation  SROM @ 0330  Bedside ultrasound performed by CNM  Pt informed that the ultrasound is considered a limited OB ultrasound and is not intended to be a complete ultrasound exam.  Patient also informed that the ultrasound is not being completed with the intent of assessing for fetal or placental anomalies or any pelvic abnormalities.  Explained that the purpose of today's ultrasound is to assess for  presentation.  Patient acknowledges the purpose of the exam and the limitations of the study.    Breech presentation confirmed by bedside US.   Lajean Manes, CNM 04/25/20, 6:21 AM

## 2020-04-26 ENCOUNTER — Other Ambulatory Visit (HOSPITAL_COMMUNITY)
Admission: RE | Admit: 2020-04-26 | Discharge: 2020-04-26 | Disposition: A | Discharge status: Home / Self Care | Payer: 59 | Source: Ambulatory Visit | Attending: Obstetrics | Admitting: Obstetrics

## 2020-04-26 HISTORY — DX: Unspecified abnormal cytological findings in specimens from vagina: R87.629

## 2020-04-26 HISTORY — DX: Gestational diabetes mellitus in pregnancy, unspecified control: O24.419

## 2020-04-26 LAB — CBC
HCT: 30 % — ABNORMAL LOW (ref 36.0–46.0)
Hemoglobin: 9.3 g/dL — ABNORMAL LOW (ref 12.0–15.0)
MCH: 28.6 pg (ref 26.0–34.0)
MCHC: 31 g/dL (ref 30.0–36.0)
MCV: 92.3 fL (ref 80.0–100.0)
Platelets: 167 10*3/uL (ref 150–400)
RBC: 3.25 MIL/uL — ABNORMAL LOW (ref 3.87–5.11)
RDW: 16.2 % — ABNORMAL HIGH (ref 11.5–15.5)
WBC: 13.1 10*3/uL — ABNORMAL HIGH (ref 4.0–10.5)
nRBC: 0 % (ref 0.0–0.2)

## 2020-04-26 LAB — BIRTH TISSUE RECOVERY COLLECTION (PLACENTA DONATION)

## 2020-04-26 MED ORDER — IBUPROFEN 600 MG PO TABS
600.0000 mg | ORAL_TABLET | Freq: Four times a day (QID) | ORAL | Status: DC
  Administered 2020-04-26 – 2020-04-27 (×5): 600 mg via ORAL
  Filled 2020-04-26 (×5): qty 1

## 2020-04-26 NOTE — Lactation Note (Addendum)
This note was copied from a baby's chart. Lactation Consultation Note  Patient Name: Bonnie Hughes [id number] Date: 04/26/2020 Reason for consult: Follow-up assessment;Other (Comment);Difficult latch;Mother's request(LGA infant greater than 9 lbs) Type of Endocrine Disorder?: Diabetes P1, 73 hour female infant LGA. Mom is currently breastfeeding and supplementing with donor breast milk. Tools given: hand pump to pre-pump left breast only to help with infant latching ( left nipple is flat) , 5 french feeding tube to supplement infant at breast with donor breast milk. Per mom, she is having difficulties latching infant at breast, she feels pain with latch her nipples are tender mom has coconut oil given by RN. LC entered room , infant very fussy and hungry, infant  is not sustain latch, LC noticed mom's left breast is flat, very little colostrum present at this time. When mom first latched infant, LC heard clicking noise, LC asked  mom to break latch and re-latch infant at breast.  Per mom, she is feeling overwhelmed ,LC stated she hears mom's concerns and we are hear to help her. [name] asked mom to pre-pump breast with hand pump, prior to latching infant with 5 french feeding tube that contained 7 mls of donor breast milk, infant was on and off breast at first and breastfed for 10 minutes. Mom will continue to work on infant sustaining latch, Mom stated she could feel difference with latch, wasn't feeling the pinching like before , this is her first time using the football hold position. Mom will continue working towards infant latching and sustaining latch at breast, mom knows to call RN and LC to help assist with latching infant at breast. Dad knows how to help assist mom with 5 french feeding tube. LC discussed using DEBP, mom knows to pump every 3 hours for 15 minutes on initial setting, mom will pump later,  she is tired at the moment. Mom shown how to use DEBP & how to disassemble, clean, &  reassemble parts. LC discussed importance of mom resting when infant is resting, per dad he will start doing STS with infant so mom can rest. Mom will be followed up later today by [facility] services.        Maternal Data    Feeding Feeding Type: Breast Fed  LATCH Score Latch: Repeated attempts needed to sustain latch, nipple held in mouth throughout feeding, stimulation needed to elicit sucking reflex.  Audible Swallowing: A few with stimulation  Type of Nipple: Flat(LC noticed mom's right breast is flat.)  Comfort (Breast/Nipple): Soft / non-tender  Hold (Positioning): Assistance needed to correctly position infant at breast and maintain latch.  LATCH Score: 6  Interventions Interventions: Assisted with latch;Adjust position;Support pillows;Skin to skin;Breast massage;Position options;Hand express;Expressed milk;Pre-pump if needed;DEBP  Lactation Tools Discussed/Used     Consult Status Consult Status: Follow-up Date: 04/26/20 Follow-up type: In-patient    [name] 04/26/2020, 1:16 AM

## 2020-04-26 NOTE — Progress Notes (Signed)
Patient is eating, ambulating, voiding, +flatus.  Pain control is good.  Appropriate lochia, no complaints.  Vitals:   04/25/20 1318 04/25/20 2104 04/26/20 0100 04/26/20 0500  BP: 128/78 117/69 129/69 124/79  Pulse: 64 82 87 83  Resp: 18 18 16 18   Temp: 97.9 F (36.6 C) 98.7 F (37.1 C) 98.1 F (36.7 C) 98.5 F (36.9 C)  TempSrc: Axillary Oral Oral Oral  SpO2: 99%     Weight:      Height:        Fundus firm Inc: c/d/i Ext: no calf tenderness  Lab Results  Component Value Date   WBC 13.1 (H) 04/26/2020   HGB 9.3 (L) 04/26/2020   HCT 30.0 (L) 04/26/2020   MCV 92.3 04/26/2020   PLT 167 04/26/2020    --/--/A POS, A POS Performed at [facility] [address] [place], [place] [address]  [phone])  A/P Post op day #1 s/p c/s for SROM/breech Doing well.  Routine care.  Expect d/c 5/20.    Bonnie Hughes

## 2020-04-27 MED ORDER — OXYCODONE-ACETAMINOPHEN 5-325 MG PO TABS: 1.0000 | ORAL_TABLET | ORAL | 0 refills | Status: DC | PRN

## 2020-04-27 NOTE — Discharge Summary (Signed)
Postpartum Discharge Summary  Date of Service updated     Patient Name: Bonnie Hughes DOB: May 21, 1991 MRN: [id number]  Date of admission: 04/25/2020 Delivery date:04/25/2020  Delivering provider: Vanessa Kick  Date of discharge: 04/27/2020  Admitting diagnosis: Cesarean delivery, delivered, current hospitalization [O82] Intrauterine pregnancy: [redacted]w[redacted]d    Secondary diagnosis:  Active Problems:   Cesarean delivery, delivered, current hospitalization  Additional problems: A2DM    Discharge diagnosis: Term Pregnancy Delivered and GDM A2                                              Post partum procedures:none Augmentation: N/A Complications: None  Hospital course: Sceduled C/S   29y.o. yo G1P1001 at 329w4das admitted to the hospital 04/25/2020 for scheduled cesarean section with the following indication:Malpresentation.Delivery details are as follows:  Membrane Rupture Time/Date: 3:30 AM ,04/25/2020   Delivery Method:C-Section, Low Transverse  Details of operation can be found in separate operative note.  Patient had an uncomplicated postpartum course.  She is ambulating, tolerating a regular diet, passing flatus, and urinating well. Patient is discharged home in stable condition on  04/27/20        Newborn Data: Birth date:04/25/2020  Birth time:8:06 AM  Gender:Female  Living status:Living  Apgars:8 ,9  Weight:4500 g     Magnesium Sulfate received: No BMZ received: No Rhophylac:No MMR:No T-DaP:Given prenatally Flu: No Transfusion:No  Physical exam  Vitals:   04/26/20 0100 04/26/20 0500 04/26/20 1506 04/27/20 0509  BP: 129/69 124/79 119/71 137/77  Pulse: 87 83 75 84  Resp: '16 18 18 17  ' Temp: 98.1 F (36.7 C) 98.5 F (36.9 C) 98.6 F (37 C) 98.3 F (36.8 C)  TempSrc: Oral Oral Oral Oral  SpO2:   99% 98%  Weight:      Height:        Labs: Lab Results  Component Value Date   WBC 13.1 (H) 04/26/2020   HGB 9.3 (L) 04/26/2020   HCT 30.0 (L) 04/26/2020   MCV  92.3 04/26/2020   PLT 167 04/26/2020   CMP Latest Ref Rng & Units 06/24/2019  Glucose 65 - 99 mg/dL 95  BUN 6 - 20 mg/dL 13  Creatinine 0.57 - 1.00 mg/dL 0.77  Sodium 134 - 144 mmol/L 138  Potassium 3.5 - 5.2 mmol/L 4.3  Chloride 96 - 106 mmol/L 100  CO2 20 - 29 mmol/L 22  Calcium 8.7 - 10.2 mg/dL 9.5  Total Protein 6.0 - 8.5 g/dL 6.7  Total Bilirubin 0.0 - 1.2 mg/dL 0.3  Alkaline Phos 39 - 117 IU/L 76  AST 0 - 40 IU/L 22  ALT 0 - 32 IU/L 17   Edinburgh Score: Edinburgh Postnatal Depression Scale Screening Tool 04/25/2020  I have been able to laugh and see the funny side of things. 0  I have looked forward with enjoyment to things. 0  I have blamed myself unnecessarily when things went wrong. 2  I have been anxious or worried for no good reason. 2  I have felt scared or panicky for no good reason. 0  Things have been getting on top of me. 1  I have been so unhappy that I have had difficulty sleeping. 0  I have felt sad or miserable. 0  I have been so unhappy that I have been crying. 0  The thought of  harming myself has occurred to me. 0  Edinburgh Postnatal Depression Scale Total 5      After visit meds:  Percocet, iron   Discharge home in stable condition Infant Feeding: Breast Infant Disposition:home with mother Discharge instruction: per After Visit Summary and Postpartum booklet. Activity: Advance as tolerated. Pelvic rest for 6 weeks.  Diet: carb modified diet Anticipated Birth Control: Unsure Postpartum Appointment:4 weeks Additional Postpartum F/U: none Future Appointments:No future appointments. Follow up Visit: Follow-up Information    Vanessa Kick, MD Follow up in 4 week(s).   Specialty: Obstetrics and Gynecology Contact information: [place] [address] [phone]               04/27/2020 Daria Pastures, MD

## 2020-04-27 NOTE — Lactation Note (Signed)
This note was copied from a baby's chart. Lactation Consultation Note  Patient Name: Bonnie Hughes [id number] Date: 04/27/2020 Reason for consult: Follow-up assessment;Early term 37-38.6wks;[place] Baby is 49 hours old/7% weight loss.  Mom states baby latches but still hungry after feeds.  Baby is receiving donor breast milk or formula supplementation.  Supplement is given using a slow flow nipple.  Discussed milk coming to volume and the prevention and treatment of engorgement.  Mom has a Spectra pump at home.  Mom would like a feeding assist.  She prefers cross cradle hold.  Mom positioned baby in cross cradle.  She hand expressed a small drop of colostrum prior to latching.  Baby latched easily and well.  She was fussy off and on at breast.  When baby came off mom latched her to opposite breast.  Active sucking but no swallows noted.  Reassured mom that baby should be more content at breast once milk comes to volume. Instructed to post pump every 3 hours to establish a good milk supply.  Mom feeling a little overwhelmed.  Support offered.  Reviewed outpatient services and encouraged to call prn.  Maternal Data    Feeding Feeding Type: Breast Fed  LATCH Score Latch: Grasps breast easily, tongue down, lips flanged, rhythmical sucking.  Audible Swallowing: None  Type of Nipple: Everted at rest and after stimulation  Comfort (Breast/Nipple): Soft / non-tender  Hold (Positioning): No assistance needed to correctly position infant at breast.  LATCH Score: 8  Interventions Interventions: Assisted with latch;Adjust position;Skin to skin;Support pillows;Breast massage;Hand express  Lactation Tools Discussed/Used     Consult Status Consult Status: Complete Follow-up type: Call as needed    Ave Filter 04/27/2020, 9:21 AM

## 2020-04-27 NOTE — Progress Notes (Signed)
  Patient is eating, ambulating, voiding.  Pain control is good.  Vitals:   04/26/20 0100 04/26/20 0500 04/26/20 1506 04/27/20 0509  BP: 129/69 124/79 119/71 137/77  Pulse: 87 83 75 84  Resp: 16 18 18 17   Temp: 98.1 F (36.7 C) 98.5 F (36.9 C) 98.6 F (37 C) 98.3 F (36.8 C)  TempSrc: Oral Oral Oral Oral  SpO2:   99% 98%  Weight:      Height:        lungs:   clear to auscultation cor:    RRR Abdomen:  soft, appropriate tenderness, incisions intact and without erythema or exudate ex:    no cords   Lab Results  Component Value Date   WBC 13.1 (H) 04/26/2020   HGB 9.3 (L) 04/26/2020   HCT 30.0 (L) 04/26/2020   MCV 92.3 04/26/2020   PLT 167 04/26/2020    --/--/A POS, A POS Performed at [facility] [address], [place],  [address]  (05/18 0550)/[place]  A/P    Post operative day 2.  Routine post op and postpartum care.  Expect d/c  today.  Percocet for pain control. Iron.

## 2020-04-28 ENCOUNTER — Inpatient Hospital Stay (HOSPITAL_COMMUNITY): Admit: 2020-04-28 | Payer: Self-pay | Admitting: Obstetrics

## 2020-05-30 DIAGNOSIS — Z3482 Encounter for supervision of other normal pregnancy, second trimester: Secondary | ICD-10-CM | POA: Diagnosis not present

## 2020-05-30 DIAGNOSIS — Z3483 Encounter for supervision of other normal pregnancy, third trimester: Secondary | ICD-10-CM | POA: Diagnosis not present

## 2020-06-16 DIAGNOSIS — Z309 Encounter for contraceptive management, unspecified: Secondary | ICD-10-CM | POA: Diagnosis not present

## 2020-07-17 DIAGNOSIS — O24415 Gestational diabetes mellitus in pregnancy, controlled by oral hypoglycemic drugs: Secondary | ICD-10-CM | POA: Diagnosis not present

## 2020-07-30 DIAGNOSIS — Z3483 Encounter for supervision of other normal pregnancy, third trimester: Secondary | ICD-10-CM | POA: Diagnosis not present

## 2020-07-30 DIAGNOSIS — Z3482 Encounter for supervision of other normal pregnancy, second trimester: Secondary | ICD-10-CM | POA: Diagnosis not present

## 2020-08-07 DIAGNOSIS — M5411 Radiculopathy, occipito-atlanto-axial region: Secondary | ICD-10-CM | POA: Diagnosis not present

## 2020-08-07 DIAGNOSIS — M9901 Segmental and somatic dysfunction of cervical region: Secondary | ICD-10-CM | POA: Diagnosis not present

## 2020-08-10 DIAGNOSIS — M9901 Segmental and somatic dysfunction of cervical region: Secondary | ICD-10-CM | POA: Diagnosis not present

## 2020-08-10 DIAGNOSIS — M5411 Radiculopathy, occipito-atlanto-axial region: Secondary | ICD-10-CM | POA: Diagnosis not present

## 2020-08-15 DIAGNOSIS — M5411 Radiculopathy, occipito-atlanto-axial region: Secondary | ICD-10-CM | POA: Diagnosis not present

## 2020-08-15 DIAGNOSIS — M9901 Segmental and somatic dysfunction of cervical region: Secondary | ICD-10-CM | POA: Diagnosis not present

## 2020-08-17 DIAGNOSIS — M9901 Segmental and somatic dysfunction of cervical region: Secondary | ICD-10-CM | POA: Diagnosis not present

## 2020-08-17 DIAGNOSIS — M5411 Radiculopathy, occipito-atlanto-axial region: Secondary | ICD-10-CM | POA: Diagnosis not present

## 2020-08-18 MED FILL — PARAGARD INTRAUTERINE COPPE: 90 days supply | Qty: 1 | Fill #0

## 2020-08-23 DIAGNOSIS — M5411 Radiculopathy, occipito-atlanto-axial region: Secondary | ICD-10-CM | POA: Diagnosis not present

## 2020-08-23 DIAGNOSIS — M9901 Segmental and somatic dysfunction of cervical region: Secondary | ICD-10-CM | POA: Diagnosis not present

## 2020-08-30 DIAGNOSIS — Z3202 Encounter for pregnancy test, result negative: Secondary | ICD-10-CM | POA: Diagnosis not present

## 2020-08-30 DIAGNOSIS — Z30430 Encounter for insertion of intrauterine contraceptive device: Secondary | ICD-10-CM | POA: Diagnosis not present

## 2020-08-31 DIAGNOSIS — M5411 Radiculopathy, occipito-atlanto-axial region: Secondary | ICD-10-CM | POA: Diagnosis not present

## 2020-08-31 DIAGNOSIS — M9901 Segmental and somatic dysfunction of cervical region: Secondary | ICD-10-CM | POA: Diagnosis not present

## 2020-09-05 DIAGNOSIS — M9901 Segmental and somatic dysfunction of cervical region: Secondary | ICD-10-CM | POA: Diagnosis not present

## 2020-09-05 DIAGNOSIS — M5411 Radiculopathy, occipito-atlanto-axial region: Secondary | ICD-10-CM | POA: Diagnosis not present

## 2020-09-07 DIAGNOSIS — Z3483 Encounter for supervision of other normal pregnancy, third trimester: Secondary | ICD-10-CM | POA: Diagnosis not present

## 2020-09-07 DIAGNOSIS — Z3482 Encounter for supervision of other normal pregnancy, second trimester: Secondary | ICD-10-CM | POA: Diagnosis not present

## 2020-09-14 DIAGNOSIS — M9901 Segmental and somatic dysfunction of cervical region: Secondary | ICD-10-CM | POA: Diagnosis not present

## 2020-09-14 DIAGNOSIS — M5411 Radiculopathy, occipito-atlanto-axial region: Secondary | ICD-10-CM | POA: Diagnosis not present

## 2020-09-25 DIAGNOSIS — Z01419 Encounter for gynecological examination (general) (routine) without abnormal findings: Secondary | ICD-10-CM | POA: Diagnosis not present

## 2020-10-09 DIAGNOSIS — M9901 Segmental and somatic dysfunction of cervical region: Secondary | ICD-10-CM | POA: Diagnosis not present

## 2020-10-09 DIAGNOSIS — M5411 Radiculopathy, occipito-atlanto-axial region: Secondary | ICD-10-CM | POA: Diagnosis not present

## 2020-10-12 DIAGNOSIS — M9901 Segmental and somatic dysfunction of cervical region: Secondary | ICD-10-CM | POA: Diagnosis not present

## 2020-10-12 DIAGNOSIS — M5411 Radiculopathy, occipito-atlanto-axial region: Secondary | ICD-10-CM | POA: Diagnosis not present

## 2020-10-24 DIAGNOSIS — M5411 Radiculopathy, occipito-atlanto-axial region: Secondary | ICD-10-CM | POA: Diagnosis not present

## 2020-10-24 DIAGNOSIS — M9901 Segmental and somatic dysfunction of cervical region: Secondary | ICD-10-CM | POA: Diagnosis not present

## 2020-10-31 DIAGNOSIS — M9901 Segmental and somatic dysfunction of cervical region: Secondary | ICD-10-CM | POA: Diagnosis not present

## 2020-10-31 DIAGNOSIS — M5411 Radiculopathy, occipito-atlanto-axial region: Secondary | ICD-10-CM | POA: Diagnosis not present

## 2020-11-20 DIAGNOSIS — M5411 Radiculopathy, occipito-atlanto-axial region: Secondary | ICD-10-CM | POA: Diagnosis not present

## 2020-11-20 DIAGNOSIS — M9901 Segmental and somatic dysfunction of cervical region: Secondary | ICD-10-CM | POA: Diagnosis not present

## 2020-12-05 DIAGNOSIS — M5411 Radiculopathy, occipito-atlanto-axial region: Secondary | ICD-10-CM | POA: Diagnosis not present

## 2020-12-05 DIAGNOSIS — M9901 Segmental and somatic dysfunction of cervical region: Secondary | ICD-10-CM | POA: Diagnosis not present

## 2020-12-09 ENCOUNTER — Telehealth: Payer: 59 | Admitting: Nurse Practitioner

## 2020-12-09 DIAGNOSIS — L089 Local infection of the skin and subcutaneous tissue, unspecified: Secondary | ICD-10-CM

## 2020-12-09 DIAGNOSIS — L700 Acne vulgaris: Secondary | ICD-10-CM | POA: Diagnosis not present

## 2020-12-09 NOTE — Progress Notes (Signed)
E Visit for Rash  We are sorry that you are not feeling well. Here is how we plan to help!  It is hard for me to say in the picture exactly what is going on . I would say it doe snot look like yeast. May be a mild bacterial infection . Try some OTC triple antibiotic ointment and let me know if that helps. No neosporin please.    HOME CARE:   Take cool showers and avoid direct sunlight.  Apply cool compress or wet dressings.  Take a bath in an oatmeal bath.  Sprinkle content of one Aveeno packet under running faucet with comfortably warm water.  Bathe for 15-20 minutes, 1-2 times daily.  Pat dry with a towel. Do not rub the rash.  Use hydrocortisone cream.  Take an antihistamine like Benadryl for widespread rashes that itch.  The adult dose of Benadryl is 25-50 mg by mouth 4 times daily.  Caution:  This type of medication may cause sleepiness.  Do not drink alcohol, drive, or operate dangerous machinery while taking antihistamines.  Do not take these medications if you have prostate enlargement.  Read package instructions thoroughly on all medications that you take.  GET HELP RIGHT AWAY IF:   Symptoms don't go away after treatment.  Severe itching that persists.  If you rash spreads or swells.  If you rash begins to smell.  If it blisters and opens or develops a yellow-brown crust.  You develop a fever.  You have a sore throat.  You become short of breath.  MAKE SURE YOU:  Understand these instructions. Will watch your condition. Will get help right away if you are not doing well or get worse.  Thank you for choosing an e-visit. Your e-visit answers were reviewed by a board certified advanced clinical practitioner to complete your personal care plan. Depending upon the condition, your plan could have included both over the counter or prescription medications. Please review your pharmacy choice. Be sure that the pharmacy you have chosen is open so that you can pick up  your prescription now.  If there is a problem you may message your provider in MyChart to have the prescription routed to another pharmacy. Your safety is important to [place]. If you have drug allergies check your prescription carefully.  For the next 24 hours, you can use MyChart to ask questions about today's visit, request a non-urgent call back, or ask for a work or school excuse from your e-visit provider. You will get an email in the next two days asking about your experience. I hope that your e-visit has been valuable and will speed your recovery.  5-10 minutes spent reviewing and documenting in chart.

## 2020-12-16 MED ORDER — DOXYCYCLINE HYCLATE 100 MG PO TABS
100.0000 mg | ORAL_TABLET | Freq: Two times a day (BID) | ORAL | 0 refills | Status: DC
Start: 2020-12-16 — End: 2021-01-24

## 2020-12-16 NOTE — Progress Notes (Signed)
We are sorry that you are experiencing this issue.  Here is how we plan to help!  Based on what you shared with me it looks like you have uncomplicated acne.  Acne is a disorder of the hair follicles and oil glands (sebaceous glands). The sebaceous glands secrete oils to keep the skin moist.  When the glands get clogged, it can lead to pimples or cysts.  These cysts may become infected and leave scars. Acne is very common and normally occurs at puberty.  Acne is also inherited.  Your personal care plan consists of the following recommendations:  I recommend that you use a daily cleanser  You might try an over the counter cleanser that has benzoyl peroxide.  I recommend that you start with a product that has 2.5% benzoyl peroxide.  Stronger concentrations have not been shown to be more effective.   I have also prescribed one of the following additional therapies:  Doxycycline an oral antibiotic 100 mg twice a day .  If your symptoms dont improve in the next 48-72 hours. Please stop taking the prescribed antibiotic and have a face to face evaluation of your symptoms.    If excessive dryness or peeling occurs, reduce dose frequency or concentration of the topical scrubs.  If excessive stinging or burning occurs, remove the topical gel with mild soap and water and resume at a lower dose the next day.  Remember oral antibiotics and topical acne treatments may increase your sensitivity to the sun!  HOME CARE:  Do not squeeze pimples because that can often lead to infections, worse acne, and scars.  Use a moisturizer that contains retinoid or fruit acids that may inhibit the development of new acne lesions.  Although there is not a clear link that foods can cause acne, doctors do believe that too many sweets predispose you to skin problems.  GET HELP RIGHT AWAY IF:  If your acne gets worse or is not better within 10 days.  If you become depressed.  If you become pregnant, discontinue  medications and call your OB/GYN.  MAKE SURE YOU:  Understand these instructions.  Will watch your condition.  Will get help right away if you are not doing well or get worse.   Your e-visit answers were reviewed by a board certified advanced clinical practitioner to complete your personal care plan.  Depending upon the condition, your plan could have included both over the counter or prescription medications.  Please review your pharmacy choice.  If there is a problem, you may contact your provider through [employer] and have the prescription routed to another pharmacy.  Your safety is important to [place].  If you have drug allergies check your prescription carefully.  For the next 24 hours you can use MyChart to ask questions about today's visit, request a non-urgent call back, or ask for a work or school excuse from your e-visit provider.  You will get an email in the next two days asking about your experience. I hope that your e-visit has been valuable and will speed your recovery.  I spent 5-10 minutes on review and completion of this note- Lacy Duverney [facility]

## 2020-12-16 NOTE — Addendum Note (Signed)
Addended by: Waldon Merl on: 12/16/2020 12:22 PM   Modules accepted: Orders

## 2020-12-28 DIAGNOSIS — M9901 Segmental and somatic dysfunction of cervical region: Secondary | ICD-10-CM | POA: Diagnosis not present

## 2020-12-28 DIAGNOSIS — M5411 Radiculopathy, occipito-atlanto-axial region: Secondary | ICD-10-CM | POA: Diagnosis not present

## 2021-01-10 DIAGNOSIS — M5411 Radiculopathy, occipito-atlanto-axial region: Secondary | ICD-10-CM | POA: Diagnosis not present

## 2021-01-10 DIAGNOSIS — M9901 Segmental and somatic dysfunction of cervical region: Secondary | ICD-10-CM | POA: Diagnosis not present

## 2021-01-24 ENCOUNTER — Ambulatory Visit (INDEPENDENT_AMBULATORY_CARE_PROVIDER_SITE_OTHER): Payer: 59 | Admitting: Family Medicine

## 2021-01-24 ENCOUNTER — Encounter: Payer: Self-pay | Admitting: Family Medicine

## 2021-01-24 DIAGNOSIS — M25512 Pain in left shoulder: Secondary | ICD-10-CM

## 2021-01-24 MED ORDER — NABUMETONE 500 MG PO TABS
500.0000 mg | ORAL_TABLET | Freq: Two times a day (BID) | ORAL | 3 refills | Status: DC | PRN
Start: 2021-01-24 — End: 2023-10-06

## 2021-01-24 NOTE — Progress Notes (Signed)
I saw and examined the patient with Dr. Elouise Munroe and agree with assessment and plan as outlined.    Seen at the request of Dr. Owens Shark.  Left shoulder pain, started as neck pain with paresthesias.  Now has decreased overhead reach, and her bra strap falls off her shoulder frequently.  Is left-handed.    Exam reveals good passive ROM, but actively cannot raise arm all the way overhead.  Has winging of the left scapula.  Has positive tinel's at ulnar groove.  Will try aggressive shoulder strengthening at home, along with NSAIDs.  If not improving, then MRI of cervical spine.  If that is unrevealing, then nerve studies.

## 2021-01-24 NOTE — Progress Notes (Signed)
Office Visit Note   Patient: Bonnie Hughes           Date of Birth: 1991/04/15           MRN: [id number] Visit Date: 01/24/2021 Requested by: Sharion Balloon, [place],  [place] [address] PCP: Sharion Balloon, FNP  Subjective: Chief Complaint  Patient presents with  . Left Shoulder - Pain    Loss of ROM since August of last year. Started seeing Dr. Purcell Nails for neck pain with radiculopathy down the arm to the 4th and 5th fingers (she attributed that to the baby nursing position she was often in). The neck and arm pain are better, but still has decreased ROM. Some movements are painful, but does not hurt at rest.    HPI: 30yo LHD F presenting to clinic with concerns of left shoulder weakness. Patient states that around last August, she was breastfeeding her now 66mo daughter, and started to feel significant left sided neck pain. This radiated down into her arm/shoulder, and caused numbness in her 4th, 5th digits. She states the pain was so severe she was in tears, and had no relief with NSAIDs or heating packs. She eventually saw a Chiropractor, who adjusted her neck and helped significantly with her pain and numbness. Unfortunately, she doesn't feel as though the shoulder weakness has improved since this time. She says that when she reaches over her head (ex to brush her hair), her left arm feels much weaker than the right. She also noticed that her bra straps are always slipping off the left shoulder now, when this never happened previously, and that her left shoulder blade feels like it sticks out more. She says that, overall, none of this is particularly painful, but it is quite worrisome to her. She is otherwise doing well, with no further numbness/tingling in the left upper arm.               ROS:   All other systems were reviewed and are negative.  Objective: Vital Signs: There were no vitals taken for this visit.  Physical Exam:  General:  Alert and oriented, in  no acute distress. Pulm:  Breathing unlabored. Psy:  Normal mood, congruent affect. Skin:  Left arm/shoulder with no bruising, rashes, or erythema.   Left Shoulder Exam:  Inspection: Symmetric muscle mass, no atrophy or deformity, no scars. Does have mild scapular winging on left.  Palpation: No tenderness to palpation over and around the acromion, over the [facility] joint or biceps insertion.  Range of motion: Reduced active range of motion to approximately 115* in abduction as well as flexion, though passive ROM is preserved.  Full External rotation to 100* degrees. Apley scratch symmetrical at approximately level of T6 vertebrae on left, T4 on right.  Rotator cuff testing:  Full strength and no pain with empty can (supraspinatus).  External rotation with full strength and no pain. Full strength and no pain with Napoleon and lift off.  AC joint testing: No AC tenderness to palpation, negative scarf test. Negative active compression test.  Labral testing: O'Brien's/speeds with full strength and no pain.  Impingement testing: Only very minimal pain with Hawkins  Instability: No sulcus sign  Strength testing:  5 out of 5 strength with shoulder abduction (C5), wrist extension (C6), wrist flexion (C7), grip strength (C8), and finger abduction (T1).  Sensation: Intact to light touch throughout bilateral upper extremities.   Brisk distal capillary refill.   Imaging: No  results found.  Assessment & Plan: 30yo F presenting to clinic with concerns of left arm weakness with overhead activities, in setting of what sounds to be a previous C8 nerve impingement. Examination also with scapular winging. Suspect she may have previously injured a nerve- and though her sensation and pain have fully recovered, her strength is slower to do so.  - Will start conservative care with ROM exercises and shoulder/scapular strengthening - If no improvement with above, would consider advanced imaging of neck to  better evaluate cervical nerve roots.  - RTC in 3-4 weeks for reassessment after performing HEP as instructed - Patient is agreeable with plan, she has no further questions or concerns today.      Procedures: No procedures performed        PMFS History: Patient Active Problem List   Diagnosis Date Noted  . Cesarean delivery, delivered, current hospitalization 04/25/2020  . Abnormal glucose tolerance test (GTT) during pregnancy, antepartum 03/04/2020  . Pregnant 10/13/2019  . Overweight (BMI 25.0-29.9) 06/24/2019  . Irregular periods 12/30/2017  . Chronic migraine without aura without status migrainosus, not intractable 01/20/2016   Past Medical History:  Diagnosis Date  . Gestational diabetes   . Heart murmur   . Vaginal Pap smear, abnormal     Family History  Problem Relation Age of Onset  . Hyperlipidemia Father   . Hypertension Father   . Anemia Maternal Grandmother   . Heart disease Maternal Grandmother   . Hyperlipidemia Paternal Grandmother   . Hypertension Paternal Grandmother   . Heart disease Paternal Grandmother   . Glaucoma Paternal Grandmother     Past Surgical History:  Procedure Laterality Date  . CESAREAN SECTION N/A 04/25/2020   Procedure: CESAREAN SECTION;  Surgeon: Vanessa Kick, MD;  Location: [facility] LD ORS;  Service: Obstetrics;  Laterality: N/A;  . COLPOSCOPY     Social History   Occupational History  . Not on file  Tobacco Use  . Smoking status: Never Smoker  . Smokeless tobacco: Never Used  Vaping Use  . Vaping Use: Never used  Substance and Sexual Activity  . Alcohol use: No  . Drug use: No  . Sexual activity: Yes    Birth control/protection: Pill

## 2021-02-25 ENCOUNTER — Encounter: Payer: Self-pay | Admitting: Family Medicine

## 2021-02-25 DIAGNOSIS — M5412 Radiculopathy, cervical region: Secondary | ICD-10-CM

## 2021-02-25 DIAGNOSIS — M25512 Pain in left shoulder: Secondary | ICD-10-CM

## 2021-02-26 NOTE — Telephone Encounter (Signed)
Order has been changed to [facility] cone

## 2021-02-27 DIAGNOSIS — M9901 Segmental and somatic dysfunction of cervical region: Secondary | ICD-10-CM | POA: Diagnosis not present

## 2021-02-27 DIAGNOSIS — M5411 Radiculopathy, occipito-atlanto-axial region: Secondary | ICD-10-CM | POA: Diagnosis not present

## 2021-03-14 ENCOUNTER — Telehealth: Payer: Self-pay | Admitting: Family Medicine

## 2021-03-14 ENCOUNTER — Other Ambulatory Visit: Payer: Self-pay

## 2021-03-14 ENCOUNTER — Ambulatory Visit (HOSPITAL_COMMUNITY)
Admission: RE | Admit: 2021-03-14 | Discharge: 2021-03-14 | Disposition: A | Discharge status: Home / Self Care | Payer: 59 | Source: Ambulatory Visit | Attending: Family Medicine | Admitting: Family Medicine

## 2021-03-14 DIAGNOSIS — M25512 Pain in left shoulder: Secondary | ICD-10-CM

## 2021-03-14 DIAGNOSIS — M5412 Radiculopathy, cervical region: Secondary | ICD-10-CM | POA: Insufficient documentation

## 2021-03-14 DIAGNOSIS — R202 Paresthesia of skin: Secondary | ICD-10-CM | POA: Diagnosis not present

## 2021-03-14 DIAGNOSIS — M50222 Other cervical disc displacement at C5-C6 level: Secondary | ICD-10-CM | POA: Diagnosis not present

## 2021-03-14 NOTE — Telephone Encounter (Signed)
See other note

## 2021-03-14 NOTE — Telephone Encounter (Signed)
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  It causes narrowing of the nerve opening on the right, but no obvious nerve compression.  Similar narrowing on the left which could affect the C6 nerve depending on what you're doing.

## 2021-03-15 NOTE — Addendum Note (Signed)
Addended by: Hortencia Pilar on: 03/15/2021 08:13 AM   Modules accepted: Orders

## 2021-03-15 NOTE — Telephone Encounter (Signed)
Called patient about MRI results.  Not in pain, but shoulder motion still hasn't returned.  Will try PT.  If still no improvement, then nerve studies.

## 2021-04-05 ENCOUNTER — Ambulatory Visit: Payer: 59 | Admitting: Rehabilitative and Restorative Service Providers"

## 2021-04-12 ENCOUNTER — Ambulatory Visit: Payer: 59 | Admitting: Rehabilitative and Restorative Service Providers"

## 2021-05-09 ENCOUNTER — Other Ambulatory Visit: Payer: Self-pay

## 2021-05-09 ENCOUNTER — Ambulatory Visit: Payer: 59 | Admitting: Physical Therapy

## 2021-05-09 DIAGNOSIS — M6281 Muscle weakness (generalized): Secondary | ICD-10-CM

## 2021-05-09 DIAGNOSIS — M25512 Pain in left shoulder: Secondary | ICD-10-CM

## 2021-05-09 DIAGNOSIS — G8929 Other chronic pain: Secondary | ICD-10-CM

## 2021-05-09 NOTE — Therapy (Signed)
[facility] Physical Therapy [address] [place], [place], [address] Phone: [phone]   Fax:  [phone]  Physical Therapy Evaluation  Patient Details  Name: Bonnie Hughes MRN: [id number] Date of Birth: 1991-05-12 Referring Provider (PT): Hilts, MD   Encounter Date: 05/09/2021   PT End of Session - 05/09/21 1544    Visit Number 1    Number of Visits 8    Date for PT Re-Evaluation 06/20/21    PT Start Time 1430    PT Stop Time 1517    PT Time Calculation (min) 47 min    Activity Tolerance Patient tolerated treatment well    Behavior During Therapy [facility] for tasks assessed/performed           Past Medical History:  Diagnosis Date  . Gestational diabetes   . Heart murmur   . Vaginal Pap smear, abnormal     Past Surgical History:  Procedure Laterality Date  . CESAREAN SECTION N/A 04/25/2020   Procedure: CESAREAN SECTION;  Surgeon: Vanessa Kick, MD;  Location[facility] LD ORS;  Service: Obstetrics;  Laterality: N/A;  . COLPOSCOPY      There were no vitals filed for this visit.    Subjective Assessment - 05/09/21 1431    Subjective Onset around August 2021 and at that time had N/T down her arm and pain in her neck.  Saw chiro with improvement. She no longer has N/T in her arm and now her chief complaint is Weakness in shoulder with her scapula winging and pain with overhead activity.    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  It causes narrowing of the nerve opening on the right, but no obvious nerve compression.  Similar narrowing on the left which could affect the C6 nerve    Patient Stated Goals improve shoulder strength and reduce pain with overhead activity    Currently in Pain? Yes    Pain Score 3     Pain Location Shoulder    Pain Orientation Left    Pain Descriptors /  Indicators Aching   pinching   Pain Type Chronic pain    Pain Radiating Towards denies N/T    Pain Onset More than a month ago    Pain Frequency Intermittent    Aggravating Factors  reaching overhead    Pain Relieving Factors rest              [facility] PT Assessment - 05/09/21 0001      Assessment   Medical Diagnosis Neck and Lt shoulder pain    Referring Provider (PT) Hilts, MD    Onset Date/Surgical Date --   chronic pain since Aug 2021   Next MD Visit nothing scheduled    Prior Therapy chiro      Balance Screen   Has the patient fallen in the past 6 months No      [place] residence      Prior Function   Level of Independence Independent    Vocation Full time employment    Vocation Requirements cardiac nurse      Cognition   Overall Cognitive Status Within Functional Limits for tasks assessed      Observation/Other Assessments   Focus on Therapeutic Outcomes (FOTO)  72% functional intake, goal is 79%  ROM / Strength   AROM / PROM / Strength AROM;PROM;Strength      AROM   AROM Assessment Site Shoulder    Right/Left Shoulder Left    Left Shoulder Flexion 125 Degrees    Left Shoulder ABduction 120 Degrees    Left Shoulder Internal Rotation --   T7 behind back, can get to T5 on Rt   Left Shoulder External Rotation --   C7 behind head, can get to T3 on Rt     PROM   Overall PROM Comments PROM WNL for Lt shoulder except for IR limited to 55 deg (has 100 deg ER PROM)      Strength   Overall Strength Comments Low trap strength (Prone Y) on Lt 3+ and Rt 4, Rhomboid strength 3+ on Lt, 4 on Rt    Strength Assessment Site Shoulder;Elbow    Right/Left Shoulder Right;Left    Right Shoulder Flexion 5/5    Right Shoulder Extension 5/5    Right Shoulder ABduction 5/5    Right Shoulder Internal Rotation 5/5    Right Shoulder External Rotation 5/5    Right Shoulder Horizontal ABduction 4/5    Left Shoulder Flexion 4+/5    Left  Shoulder Extension 4-/5    Left Shoulder ABduction 5/5    Left Shoulder Internal Rotation 5/5    Left Shoulder External Rotation 5/5    Left Shoulder Horizontal ABduction 3+/5    Right/Left Elbow Left    Left Elbow Flexion 5/5    Left Elbow Extension 5/5      Special Tests   Other special tests GH mobility WNL except decreased IR glide, inconclusive impingment signs with + painful arc, neers test, negative empty can test and hawkins kennedy test                      Objective measurements completed on examination: See above findings.       [facility] PT Treatment/Exercise - 05/09/21 0001      Exercises   Exercises Shoulder      Shoulder Exercises: Supine   Protraction Weight (lbs) 8    Protraction Limitations 2X10 bilat, then 2X10 Lt only    Horizontal ABduction Both    Theraband Level (Shoulder Horizontal ABduction) Level 3 (Green)    Horizontal ABduction Limitations 2X15    External Rotation Both    Theraband Level (Shoulder External Rotation) Level 3 (Green)    External Rotation Limitations 2X15      Shoulder Exercises: Prone   Other Prone Exercises Prone Y,T,I 3 sec hold X10 ea for Lt      Shoulder Exercises: Stretch   Cross Chest Stretch 3 reps;10 seconds    Internal Rotation Stretch 10 seconds    Internal Rotation Stretch Limitations strap behind back    Other Shoulder Stretches sleeper stretch 10 sec X3 on Lt                  PT Education - 05/09/21 1543    Education Details HEP,POC,exam findings    Person(s) Educated Patient    Methods Explanation;Demonstration;Verbal cues;Handout    Comprehension Verbalized understanding;Returned demonstration               PT Long Term Goals - 05/09/21 1549      PT LONG TERM GOAL #1   Title Pt will improve FOTO score to 79% functional outcome    Time 6    Period Weeks    Status New  Target Date 06/20/21      PT LONG TERM GOAL #2   Title Pt will reduce overall pain with overhead  activity and sleeping to no more than mild pain    Time 6    Period Weeks    Status New      PT LONG TERM GOAL #3   Title Pt will improve Lt shoulder strength to 5/5 overall in standing and prone strength to at least 4+ on Lt    Time 6    Period Weeks    Status New    Target Date 06/20/21      PT LONG TERM GOAL #4   Title She will improve Lt shoulder AROM to WNL    Time 6    Period Weeks    Status New    Target Date 06/20/21                  Plan - 05/09/21 1544    Clinical Impression Statement Pt presents with Lt shoulder pain overhead, IR limitation and weakness in parascapular muscles consistent with secondary shoulder impingment. She is no longer having neck pain or cervical radiculopathy so prognosis is good at this time. She will benefit from skilled PT to address her impairments and improve overall functional use of her Lt arm.    Personal Factors and Comorbidities Time since onset of injury/illness/exacerbation    Examination-Activity Limitations Caring for Others;Lift;Reach Overhead;Sleep    Examination-Participation Restrictions Cleaning;Driving;Occupation    Stability/Clinical Decision Making Stable/Uncomplicated    Clinical Decision Making Low    Rehab Potential Good    PT Frequency 1x / week   1-2   PT Treatment/Interventions ADLs/Self Care Home Management;Cryotherapy;Electrical Stimulation;Iontophoresis 4mg /ml Dexamethasone;Moist Heat;Traction;Ultrasound;Therapeutic activities;Therapeutic exercise;Neuromuscular re-education;Manual techniques;Passive range of motion;Dry needling;Joint Manipulations;Spinal Manipulations;Taping;Vasopneumatic Device    PT Next Visit Plan review and update HEP PRN, needs scapular stabilization focus    PT Home Exercise Plan Access Code: [email or web address]    Consulted and Agree with Plan of Care Patient           Patient will benefit from skilled therapeutic intervention in order to improve the following deficits and impairments:   Decreased activity tolerance,Decreased mobility,Decreased range of motion,Decreased strength,Pain,Impaired UE functional use  Visit Diagnosis: Chronic left shoulder pain  Muscle weakness (generalized)     Problem List Patient Active Problem List   Diagnosis Date Noted  . Cesarean delivery, delivered, current hospitalization 04/25/2020  . Abnormal glucose tolerance test (GTT) during pregnancy, antepartum 03/04/2020  . Pregnant 10/13/2019  . Overweight (BMI 25.0-29.9) 06/24/2019  . Irregular periods 12/30/2017  . Chronic migraine without aura without status migrainosus, not intractable 01/20/2016    Silvestre Mesi 05/09/2021, 3:56 PM  [facility] Physical Therapy [address] [place], [place], [address] Phone: [phone]   Fax:  [phone]  Name: JAELYNE DEEG MRN: [id number] Date of Birth: July 23, 1991

## 2021-05-09 NOTE — Patient Instructions (Signed)
Access Code: [email or web address] URL: [email or web address] Date: 05/09/2021 Prepared by: Elsie Ra  Exercises Standing Shoulder Posterior Capsule Stretch - 2 x daily - 3-4 x weekly - 1 sets - 5 reps - 10 hold Sleeper Stretch - 2 x daily - 6 x weekly - 1 sets - 5 reps - 10 hold Supine Scapular Protraction in Flexion with Dumbbells - 2 x daily - 6 x weekly - 3 sets - 10 reps Supine Shoulder Horizontal Abduction with Resistance - 2 x daily - 6 x weekly - 2 sets - 15-20 reps Shoulder External Rotation and Scapular Retraction with Resistance - 2 x daily - 6 x weekly - 2 sets - 15-20 reps Prone Single Arm Shoulder Y with Dumbbell - 2 x daily - 6 x weekly - 2 sets - 10 reps Prone Single Arm Shoulder Horizontal Abduction with Dumbbell - 2 x daily - 6 x weekly - 2 sets - 10 reps Prone Shoulder Extension - Single Arm - 2 x daily - 3-4 x weekly - 2 sets - 10 reps - 3 sec hold Standing Shoulder Internal Rotation Stretch with Towel - 2 x daily - 6 x weekly - 1 sets - 5 reps - 10 sec hold

## 2021-05-22 ENCOUNTER — Encounter: Payer: 59 | Admitting: Physical Therapy

## 2021-05-29 ENCOUNTER — Encounter: Payer: 59 | Admitting: Physical Therapy

## 2021-05-29 ENCOUNTER — Telehealth: Payer: Self-pay | Admitting: Physical Therapy

## 2021-05-29 NOTE — Telephone Encounter (Signed)
Pt no show for PT appointment today. They were contacted and informed of this via voicemail. They were provided the date and time of their next appointment on voicemail. They were instructed to call us to let us know if they cannot make their appointment.   Elsie Ra, PT, DPT 05/29/21 3:38 PM

## 2021-06-04 DIAGNOSIS — M9901 Segmental and somatic dysfunction of cervical region: Secondary | ICD-10-CM | POA: Diagnosis not present

## 2021-06-04 DIAGNOSIS — M5411 Radiculopathy, occipito-atlanto-axial region: Secondary | ICD-10-CM | POA: Diagnosis not present

## 2021-06-05 ENCOUNTER — Other Ambulatory Visit: Payer: Self-pay

## 2021-06-05 ENCOUNTER — Ambulatory Visit: Payer: 59 | Admitting: Physical Therapy

## 2021-06-05 DIAGNOSIS — M25512 Pain in left shoulder: Secondary | ICD-10-CM | POA: Diagnosis not present

## 2021-06-05 DIAGNOSIS — M6281 Muscle weakness (generalized): Secondary | ICD-10-CM

## 2021-06-05 DIAGNOSIS — G8929 Other chronic pain: Secondary | ICD-10-CM | POA: Diagnosis not present

## 2021-06-05 NOTE — Patient Instructions (Signed)
Access Code: [email or web address] URL: [email or web address] Date: 06/05/2021 Prepared by: Elsie Ra  Exercises Standing Shoulder Internal Rotation Stretch with Towel - 1 x daily - 3 x weekly - 1 sets - 5 reps - 10 sec hold Supine Scapular Protraction in Flexion with Dumbbells - 1 x daily - 6 x weekly - 3 sets - 10 reps Shoulder External Rotation and Scapular Retraction with Resistance - 1 x daily - 6 x weekly - 2 sets - 15-20 reps Prone Single Arm Shoulder Y with Dumbbell - 1 x daily - 6 x weekly - 2 sets - 10 reps Prone Single Arm Shoulder Horizontal Abduction with Dumbbell - 1 x daily - 6 x weekly - 2 sets - 10 reps Prone Shoulder Extension - Single Arm - 1 x daily - 3-4 x weekly - 2 sets - 10 reps - 3 sec hold Quadruped Alternating Arm Lift - 1 x daily - 6 x weekly - 1-2 sets - 10 reps Kneeling Plank with Scapular Protraction Retraction AROM - 1 x daily - 6 x weekly - 1-2 sets - 10 reps Standing Low Trap Setting with Resistance at Wall - 1 x daily - 6 x weekly - 2-3 sets - 10 reps

## 2021-06-05 NOTE — Therapy (Signed)
[facility] Physical Therapy [address] [place], [place], [address] Phone: [phone]   Fax:  [phone]  Physical Therapy Treatment  Patient Details  Name: Bonnie Hughes MRN: [id number] Date of Birth: [birth year] Referring Provider (PT): Hilts, MD   Encounter Date: 06/05/2021   PT End of Session - 06/05/21 1558     Visit Number 2    Number of Visits 8    Date for PT Re-Evaluation 06/20/21    PT Start Time 1510    PT Stop Time 5053    PT Time Calculation (min) 38 min    Activity Tolerance Patient tolerated treatment well    Behavior During Therapy [facility] for tasks assessed/performed             Past Medical History:  Diagnosis Date   Gestational diabetes    Heart murmur    Vaginal Pap smear, abnormal     Past Surgical History:  Procedure Laterality Date   CESAREAN SECTION N/A 04/25/2020   Procedure: CESAREAN SECTION;  Surgeon: Vanessa Kick, MD;  Location: [facility] LD ORS;  Service: Obstetrics;  Laterality: N/A;   COLPOSCOPY      There were no vitals filed for this visit.   Subjective Assessment - 06/05/21 1553     Subjective She can tell a big difference with her ROM and is not having much pain but can tell her Left arm is still weaker. She relays compliance with HEP.    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  It causes narrowing of the nerve opening on the right, but no obvious nerve compression.  Similar narrowing on the left which could affect the C6 nerve    Patient Stated Goals improve shoulder strength and reduce pain with overhead activity    Pain Onset More than a month ago                [facility] PT Assessment - 06/05/21 0001       Assessment   Medical Diagnosis Neck and Lt shoulder pain    Referring Provider (PT) Hilts, MD      AROM   Overall AROM  Comments now Lt shoulder AROM is WNL      Strength   Overall Strength Comments Low trap strength (Prone Y) on Lt 4 and Rt 4+, Lt prone T and I now improved to 4+/5 from 3                           [facility] Adult PT Treatment/Exercise - 06/05/21 0001       Shoulder Exercises: Prone   Other Prone Exercises quadriped alt arm lifts 2 sec holds X10 ea, quadriped protraction/retraction X10, push ups with plus X10 from knees, elevator planks from knees X10      Shoulder Exercises: Standing   Other Standing Exercises resisted Y with wall slides green 2x10                         PT Long Term Goals - 05/09/21 1549       PT LONG TERM GOAL #1   Title Pt will improve FOTO score to 79% functional outcome    Time 6    Period Weeks    Status New    Target Date  06/20/21      PT LONG TERM GOAL #2   Title Pt will reduce overall pain with overhead activity and sleeping to no more than mild pain    Time 6    Period Weeks    Status New      PT LONG TERM GOAL #3   Title Pt will improve Lt shoulder strength to 5/5 overall in standing and prone strength to at least 4+ on Lt    Time 6    Period Weeks    Status New    Target Date 06/20/21      PT LONG TERM GOAL #4   Title She will improve Lt shoulder AROM to WNL    Time 6    Period Weeks    Status New    Target Date 06/20/21                   Plan - 06/05/21 1558     Clinical Impression Statement She now has WNL Lt shoulder ROM and is doing well with pain. Progressed her HEP for less stretching and more strengthening with focus on scapular stabiization and she showed good understanding and return demo with this. She will trial HEP and follow up with PT PRN.    Personal Factors and Comorbidities Time since onset of injury/illness/exacerbation    Examination-Activity Limitations Caring for Others;Lift;Reach Overhead;Sleep    Examination-Participation Restrictions Cleaning;Driving;Occupation     Stability/Clinical Decision Making Stable/Uncomplicated    Rehab Potential Good    PT Frequency 1x / week   1-2   PT Treatment/Interventions ADLs/Self Care Home Management;Cryotherapy;Electrical Stimulation;Iontophoresis 4mg /ml Dexamethasone;Moist Heat;Traction;Ultrasound;Therapeutic activities;Therapeutic exercise;Neuromuscular re-education;Manual techniques;Passive range of motion;Dry needling;Joint Manipulations;Spinal Manipulations;Taping;Vasopneumatic Device    PT Next Visit Plan review and update HEP PRN, needs scapular stabilization focus    PT Home Exercise Plan Access Code: [email or web address]    Consulted and Agree with Plan of Care Patient             Patient will benefit from skilled therapeutic intervention in order to improve the following deficits and impairments:  Decreased activity tolerance, Decreased mobility, Decreased range of motion, Decreased strength, Pain, Impaired UE functional use  Visit Diagnosis: Chronic left shoulder pain  Muscle weakness (generalized)     Problem List Patient Active Problem List   Diagnosis Date Noted   Cesarean delivery, delivered, current hospitalization 04/25/2020   Abnormal glucose tolerance test (GTT) during pregnancy, antepartum 03/04/2020   Pregnant 10/13/2019   Overweight (BMI 25.0-29.9) 06/24/2019   Irregular periods 12/30/2017   Chronic migraine without aura without status migrainosus, not intractable 01/20/2016    Silvestre Mesi 06/05/2021, 4:00 PM  [facility] Physical Therapy [address] [place], [place], [address] Phone: [phone]   Fax:  [phone]  Name: PERNIE GROSSO MRN: [id number] Date of Birth: [birth year]

## 2021-06-12 ENCOUNTER — Telehealth: Payer: Self-pay | Admitting: Physical Therapy

## 2021-06-12 ENCOUNTER — Encounter: Payer: 59 | Admitting: Physical Therapy

## 2021-06-12 NOTE — Telephone Encounter (Signed)
Pt no show for PT appointment today. They were contacted and informed of this via voicemail. They were provided the date and time of their next appointment on voicemail.   Elsie Ra, PT, DPT 06/12/21 3:50 PM

## 2021-06-15 ENCOUNTER — Encounter: Payer: 59 | Admitting: Family

## 2021-06-19 ENCOUNTER — Other Ambulatory Visit: Payer: Self-pay

## 2021-06-19 ENCOUNTER — Ambulatory Visit: Payer: 59 | Admitting: Physical Therapy

## 2021-06-19 DIAGNOSIS — M6281 Muscle weakness (generalized): Secondary | ICD-10-CM | POA: Diagnosis not present

## 2021-06-19 DIAGNOSIS — M25512 Pain in left shoulder: Secondary | ICD-10-CM | POA: Diagnosis not present

## 2021-06-19 DIAGNOSIS — G8929 Other chronic pain: Secondary | ICD-10-CM | POA: Diagnosis not present

## 2021-06-19 NOTE — Therapy (Signed)
[facility] Physical Therapy [address] [place], [place], [address] Phone: [phone]   Fax:  [phone]  Physical Therapy Treatment/Discharge PHYSICAL THERAPY DISCHARGE SUMMARY  Visits from Start of Care: 3  Current functional level related to goals / functional outcomes: See below   Remaining deficits: See below   Education / Equipment: HEP Plan: Patient agrees to discharge.  Patient goals were met. Patient is being discharged due to meeting the stated rehab goals.      Patient Details  Name: Bonnie Hughes MRN: [id number] Date of Birth: June 02, 1991 Referring Provider (PT): Hilts, MD   Encounter Date: 06/19/2021   PT End of Session - 06/19/21 1611     Visit Number 3    Number of Visits 8    Date for PT Re-Evaluation 06/20/21    PT Start Time 4818    PT Stop Time 1554    PT Time Calculation (min) 39 min    Activity Tolerance Patient tolerated treatment well    Behavior During Therapy [facility] for tasks assessed/performed             Past Medical History:  Diagnosis Date   Gestational diabetes    Heart murmur    Vaginal Pap smear, abnormal     Past Surgical History:  Procedure Laterality Date   CESAREAN SECTION N/A 04/25/2020   Procedure: CESAREAN SECTION;  Surgeon: Vanessa Kick, MD;  Location: [facility] LD ORS;  Service: Obstetrics;  Laterality: N/A;   COLPOSCOPY      There were no vitals filed for this visit.   Subjective Assessment - 06/19/21 1610     Subjective denies pain, can still feel slight weakness with OH activity with left shoulder but overall pleased and ready to discharge today.    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  It causes narrowing of the nerve opening on the right, but no obvious nerve compression.  Similar narrowing on the left which could  affect the C6 nerve    Patient Stated Goals improve shoulder strength and reduce pain with overhead activity    Pain Onset More than a month ago                [facility] PT Assessment - 06/19/21 0001       Assessment   Medical Diagnosis Neck and Lt shoulder pain    Referring Provider (PT) Hilts, MD      AROM   Overall AROM Comments now Lt shoulder AROM is WNL      Strength   Overall Strength Comments Low trap strength (Prone Y), Lt prone T and I now improved to 5- out of 5 bilat    Left Shoulder Flexion 5/5    Left Shoulder Extension 5/5    Left Shoulder ABduction 5/5    Left Shoulder Internal Rotation 5/5                           [facility] Adult PT Treatment/Exercise - 06/19/21 0001       Shoulder Exercises: Prone   Other Prone Exercises elevator planks from knees 2x10, push ups on bosu ball from knees 2X10    Other Prone Exercises prone Y,T,I with 1# 2X10 bilat on pball      Shoulder Exercises: Standing   External Rotation Limitations Lt  ER iso hold moving into flexion OH 2X10    Other Standing Exercises curl with OH press on Lt 10# 2X8, then 5 lb X10      Shoulder Exercises: Stretch   Other Shoulder Stretches behind head ER stretch 10 sec                         PT Long Term Goals - 06/19/21 1613       PT LONG TERM GOAL #1   Title Pt will improve FOTO score to 79% functional outcome    Time 6    Period Weeks    Status Deferred      PT LONG TERM GOAL #2   Title Pt will reduce overall pain with overhead activity and sleeping to no more than mild pain    Time 6    Period Weeks    Status Achieved      PT LONG TERM GOAL #3   Title Pt will improve Lt shoulder strength to 5/5 overall in standing and prone strength to at least 4+ on Lt    Time 6    Period Weeks    Status Achieved      PT LONG TERM GOAL #4   Title She will improve Lt shoulder AROM to WNL    Time 6    Period Weeks    Status Achieved                    Plan - 06/19/21 1613     Clinical Impression Statement She has been compliant with HEP for the most part and has met all her PT goals. She is pleased with her functional level and agrees to discharge today. She was however informed she could still return in the event she has a setback or feels like she needs to.    Personal Factors and Comorbidities Time since onset of injury/illness/exacerbation    Examination-Activity Limitations Caring for Others;Lift;Reach Overhead;Sleep    Examination-Participation Restrictions Cleaning;Driving;Occupation    Stability/Clinical Decision Making Stable/Uncomplicated    Rehab Potential Good    PT Frequency 1x / week   1-2   PT Treatment/Interventions ADLs/Self Care Home Management;Cryotherapy;Electrical Stimulation;Iontophoresis 25m/ml Dexamethasone;Moist Heat;Traction;Ultrasound;Therapeutic activities;Therapeutic exercise;Neuromuscular re-education;Manual techniques;Passive range of motion;Dry needling;Joint Manipulations;Spinal Manipulations;Taping;Vasopneumatic Device    PT Next Visit Plan review and update HEP PRN, needs scapular stabilization focus    PT Home Exercise Plan Access Code: [email or web address]    Consulted and Agree with Plan of Care Patient             Patient will benefit from skilled therapeutic intervention in order to improve the following deficits and impairments:  Decreased activity tolerance, Decreased mobility, Decreased range of motion, Decreased strength, Pain, Impaired UE functional use  Visit Diagnosis: Chronic left shoulder pain  Muscle weakness (generalized)     Problem List Patient Active Problem List   Diagnosis Date Noted   Cesarean delivery, delivered, current hospitalization 04/25/2020   Abnormal glucose tolerance test (GTT) during pregnancy, antepartum 03/04/2020   Pregnant 10/13/2019   Overweight (BMI 25.0-29.9) 06/24/2019   Irregular periods 12/30/2017   Chronic migraine without aura without status migrainosus, not  intractable 01/20/2016    BSilvestre Mesi7/11/2021, 4:14 PM  [facility]St [facility] Inc[facility] Therapy [address] StreetGSon[place] [address]Phone: [phone]  Fax:  [phone] Name: Bonnie VIVERETTEMRN: [id number] of Birth: 923-Jun-1992

## 2021-08-28 DIAGNOSIS — H5213 Myopia, bilateral: Secondary | ICD-10-CM | POA: Diagnosis not present

## 2021-09-28 DIAGNOSIS — Z01419 Encounter for gynecological examination (general) (routine) without abnormal findings: Secondary | ICD-10-CM | POA: Diagnosis not present

## 2021-09-28 DIAGNOSIS — Z30432 Encounter for removal of intrauterine contraceptive device: Secondary | ICD-10-CM | POA: Diagnosis not present

## 2021-12-17 DIAGNOSIS — M9901 Segmental and somatic dysfunction of cervical region: Secondary | ICD-10-CM | POA: Diagnosis not present

## 2021-12-17 DIAGNOSIS — M5411 Radiculopathy, occipito-atlanto-axial region: Secondary | ICD-10-CM | POA: Diagnosis not present

## 2022-01-11 ENCOUNTER — Other Ambulatory Visit: Payer: Self-pay | Admitting: Cardiology

## 2022-01-12 LAB — LIPID PANEL W/O CHOL/HDL RATIO
Cholesterol, Total: 207 mg/dL — ABNORMAL HIGH (ref 100–199)
HDL: 50 mg/dL (ref >39)
LDL Chol Calc (NIH): 134 mg/dL — ABNORMAL HIGH (ref 0–99)
Triglycerides: 130 mg/dL (ref 0–149)
VLDL Cholesterol Cal: 23 mg/dL (ref 5–40)

## 2022-02-14 DIAGNOSIS — M9901 Segmental and somatic dysfunction of cervical region: Secondary | ICD-10-CM | POA: Diagnosis not present

## 2022-02-14 DIAGNOSIS — M5411 Radiculopathy, occipito-atlanto-axial region: Secondary | ICD-10-CM | POA: Diagnosis not present

## 2022-07-31 ENCOUNTER — Ambulatory Visit: Payer: 59 | Admitting: Nurse Practitioner

## 2022-08-27 ENCOUNTER — Encounter: Payer: 59 | Admitting: Family

## 2022-08-29 ENCOUNTER — Encounter: Payer: Self-pay | Admitting: Family

## 2022-10-09 DIAGNOSIS — Z01419 Encounter for gynecological examination (general) (routine) without abnormal findings: Secondary | ICD-10-CM | POA: Diagnosis not present

## 2022-10-09 DIAGNOSIS — Z124 Encounter for screening for malignant neoplasm of cervix: Secondary | ICD-10-CM | POA: Diagnosis not present

## 2022-10-09 DIAGNOSIS — L659 Nonscarring hair loss, unspecified: Secondary | ICD-10-CM | POA: Diagnosis not present

## 2022-10-18 LAB — CYTOLOGY - PAP
HM Pap smear: ABNORMAL
HPV, high-risk: NEGATIVE

## 2022-10-21 ENCOUNTER — Telehealth: Payer: 59 | Admitting: Family Medicine

## 2022-10-21 DIAGNOSIS — J069 Acute upper respiratory infection, unspecified: Secondary | ICD-10-CM | POA: Diagnosis not present

## 2022-10-21 MED ORDER — ALBUTEROL SULFATE HFA 108 (90 BASE) MCG/ACT IN AERS: 2.0000 | INHALATION_SPRAY | Freq: Four times a day (QID) | RESPIRATORY_TRACT | 0 refills | Status: DC | PRN

## 2022-10-21 MED ORDER — PROMETHAZINE-DM 6.25-15 MG/5ML PO SYRP: 5.0000 mL | ORAL_SOLUTION | Freq: Three times a day (TID) | ORAL | 0 refills | Status: DC | PRN

## 2022-10-21 MED ORDER — BENZONATATE 100 MG PO CAPS: 100.0000 mg | ORAL_CAPSULE | Freq: Three times a day (TID) | ORAL | 0 refills | Status: DC | PRN

## 2022-10-21 NOTE — Progress Notes (Signed)
We are sorry that you are not feeling well.  Here is how we plan to help!  Based on your presentation I believe you most likely have A cough due to a virus.  This is called viral bronchitis and is best treated by rest, plenty of fluids and control of the cough.  You may use Ibuprofen or Tylenol as directed to help your symptoms.     In addition you may use A prescription cough medication called Tessalon Perles '100mg'$ . You may take 1-2 capsules every 8 hours as needed for your cough.  I also have ordered Promethazine DM for your cough and congestion. Please take Mucinex (plain) with this to help.  I will order you an inhaler to have on hand as well.  From your responses in the eVisit questionnaire you describe inflammation in the upper respiratory tract which is causing a significant cough.  This is commonly called Bronchitis and has four common causes:   Allergies Viral Infections Acid Reflux Bacterial Infection Allergies, viruses and acid reflux are treated by controlling symptoms or eliminating the cause. An example might be a cough caused by taking certain blood pressure medications. You stop the cough by changing the medication. Another example might be a cough caused by acid reflux. Controlling the reflux helps control the cough.  USE OF BRONCHODILATOR ("RESCUE") INHALERS: There is a risk from using your bronchodilator too frequently.  The risk is that over-reliance on a medication which only relaxes the muscles surrounding the breathing tubes can reduce the effectiveness of medications prescribed to reduce swelling and congestion of the tubes themselves.  Although you feel brief relief from the bronchodilator inhaler, your asthma may actually be worsening with the tubes becoming more swollen and filled with mucus.  This can delay other crucial treatments, such as oral steroid medications. If you need to use a bronchodilator inhaler daily, several times per day, you should discuss this with  your provider.  There are probably better treatments that could be used to keep your asthma under control.     HOME CARE Only take medications as instructed by your medical team. Complete the entire course of an antibiotic. Drink plenty of fluids and get plenty of rest. Avoid close contacts especially the very young and the elderly Cover your mouth if you cough or cough into your sleeve. Always remember to wash your hands A steam or ultrasonic humidifier can help congestion.   GET HELP RIGHT AWAY IF: You develop worsening fever. You become short of breath You cough up blood. Your symptoms persist after you have completed your treatment plan MAKE SURE YOU  Understand these instructions. Will watch your condition. Will get help right away if you are not doing well or get worse.    Thank you for choosing an e-visit.  Your e-visit answers were reviewed by a board certified advanced clinical practitioner to complete your personal care plan. Depending upon the condition, your plan could have included both over the counter or prescription medications.  Please review your pharmacy choice. Make sure the pharmacy is open so you can pick up prescription now. If there is a problem, you may contact your provider through [employer] and have the prescription routed to another pharmacy.  Your safety is important to [place]. If you have drug allergies check your prescription carefully.   For the next 24 hours you can use MyChart to ask questions about today's visit, request a non-urgent call back, or ask for a work or school excuse.  You will get an email in the next two days asking about your experience. I hope that your e-visit has been valuable and will speed your recovery.  I provided 5 minutes of non face-to-face time during this encounter for chart review, medication and order placement, as well as and documentation.

## 2022-10-28 ENCOUNTER — Telehealth: Payer: 59 | Admitting: Physician Assistant

## 2022-10-28 DIAGNOSIS — J208 Acute bronchitis due to other specified organisms: Secondary | ICD-10-CM

## 2022-10-28 DIAGNOSIS — B9689 Other specified bacterial agents as the cause of diseases classified elsewhere: Secondary | ICD-10-CM | POA: Diagnosis not present

## 2022-10-29 MED ORDER — PROMETHAZINE-DM 6.25-15 MG/5ML PO SYRP: 5.0000 mL | ORAL_SOLUTION | Freq: Three times a day (TID) | ORAL | 0 refills | Status: DC | PRN

## 2022-10-29 NOTE — Progress Notes (Signed)
We are sorry that you are not feeling well.  Here is how we plan to help!  Based on your presentation I believe you most likely have A cough due to bacteria.  When patients have a fever and a productive cough with a change in color or increased sputum production, we are concerned about bacterial bronchitis.  If left untreated it can progress to pneumonia.  If your symptoms do not improve with your treatment plan it is important that you contact your provider.   I have prescribed Azithromyin 250 mg: two tablets now and then one tablet daily for 4 additonal days    In addition you may continue using the prescribed cough medicines.    From your responses in the eVisit questionnaire you describe inflammation in the upper respiratory tract which is causing a significant cough.  This is commonly called Bronchitis and has four common causes:   Allergies Viral Infections Acid Reflux Bacterial Infection Allergies, viruses and acid reflux are treated by controlling symptoms or eliminating the cause. An example might be a cough caused by taking certain blood pressure medications. You stop the cough by changing the medication. Another example might be a cough caused by acid reflux. Controlling the reflux helps control the cough.  USE OF BRONCHODILATOR ("RESCUE") INHALERS: There is a risk from using your bronchodilator too frequently.  The risk is that over-reliance on a medication which only relaxes the muscles surrounding the breathing tubes can reduce the effectiveness of medications prescribed to reduce swelling and congestion of the tubes themselves.  Although you feel brief relief from the bronchodilator inhaler, your asthma may actually be worsening with the tubes becoming more swollen and filled with mucus.  This can delay other crucial treatments, such as oral steroid medications. If you need to use a bronchodilator inhaler daily, several times per day, you should discuss this with your provider.  There  are probably better treatments that could be used to keep your asthma under control.     HOME CARE Only take medications as instructed by your medical team. Complete the entire course of an antibiotic. Drink plenty of fluids and get plenty of rest. Avoid close contacts especially the very young and the elderly Cover your mouth if you cough or cough into your sleeve. Always remember to wash your hands A steam or ultrasonic humidifier can help congestion.   GET HELP RIGHT AWAY IF: You develop worsening fever. You become short of breath You cough up blood. Your symptoms persist after you have completed your treatment plan MAKE SURE YOU  Understand these instructions. Will watch your condition. Will get help right away if you are not doing well or get worse.    Thank you for choosing an e-visit.  Your e-visit answers were reviewed by a board certified advanced clinical practitioner to complete your personal care plan. Depending upon the condition, your plan could have included both over the counter or prescription medications.  Please review your pharmacy choice. Make sure the pharmacy is open so you can pick up prescription now. If there is a problem, you may contact your provider through [employer] and have the prescription routed to another pharmacy.  Your safety is important to [place]. If you have drug allergies check your prescription carefully.   For the next 24 hours you can use MyChart to ask questions about today's visit, request a non-urgent call back, or ask for a work or school excuse. You will get an email in the next two  days asking about your experience. I hope that your e-visit has been valuable and will speed your recovery.

## 2022-10-29 NOTE — Addendum Note (Signed)
Addended by: Brunetta Jeans on: 10/29/2022 07:28 AM   Modules accepted: Orders

## 2022-10-29 NOTE — Progress Notes (Signed)
I have spent 5 minutes in review of e-visit questionnaire, review and updating patient chart, medical decision making and response to patient.   Sun Kihn Cody Jacobe Study, PA-C    

## 2022-11-21 ENCOUNTER — Other Ambulatory Visit (HOSPITAL_COMMUNITY): Payer: Self-pay

## 2022-11-21 ENCOUNTER — Telehealth: Payer: 59 | Admitting: Family Medicine

## 2022-11-21 DIAGNOSIS — J019 Acute sinusitis, unspecified: Secondary | ICD-10-CM

## 2022-11-21 DIAGNOSIS — B9689 Other specified bacterial agents as the cause of diseases classified elsewhere: Secondary | ICD-10-CM | POA: Diagnosis not present

## 2022-11-21 MED ORDER — PSEUDOEPH-BROMPHEN-DM 30-2-10 MG/5ML PO SYRP
5.0000 mL | ORAL_SOLUTION | Freq: Four times a day (QID) | ORAL | 0 refills | Status: DC | PRN
  Filled 2022-11-21: qty 120, 6d supply, fill #0

## 2022-11-21 MED ORDER — AMOXICILLIN-POT CLAVULANATE 875-125 MG PO TABS
1.0000 | ORAL_TABLET | Freq: Two times a day (BID) | ORAL | 0 refills | Status: AC
  Filled 2022-11-21: qty 14, 7d supply, fill #0

## 2022-11-21 NOTE — Progress Notes (Signed)
E-Visit for Sinus Problems  We are sorry that you are not feeling well.  Here is how we plan to help!  Based on what you have shared with me it looks like you have sinusitis.  Sinusitis is inflammation and infection in the sinus cavities of the head.  Based on your presentation I believe you most likely have Acute Bacterial Sinusitis.  This is an infection caused by bacteria and is treated with antibiotics. I have prescribed Augmentin '875mg'$ /'125mg'$  one tablet twice daily with food, for 7 days. You may use an oral decongestant such as Mucinex D or if you have glaucoma or high blood pressure use plain Mucinex. Saline nasal spray help and can safely be used as often as needed for congestion.  If you develop worsening sinus pain, fever or notice severe headache and vision changes, or if symptoms are not better after completion of antibiotic, please schedule an appointment with a health care provider.    I am also going to send in some cough syrup to help with congestion as well.   Sinus infections are not as easily transmitted as other respiratory infection, however we still recommend that you avoid close contact with loved ones, especially the very young and elderly.  Remember to wash your hands thoroughly throughout the day as this is the number one way to prevent the spread of infection!  Home Care: Only take medications as instructed by your medical team. Complete the entire course of an antibiotic. Do not take these medications with alcohol. A steam or ultrasonic humidifier can help congestion.  You can place a towel over your head and breathe in the steam from hot water coming from a faucet. Avoid close contacts especially the very young and the elderly. Cover your mouth when you cough or sneeze. Always remember to wash your hands.  Get Help Right Away If: You develop worsening fever or sinus pain. You develop a severe head ache or visual changes. Your symptoms persist after you have completed  your treatment plan.  Make sure you Understand these instructions. Will watch your condition. Will get help right away if you are not doing well or get worse.  Thank you for choosing an e-visit.  Your e-visit answers were reviewed by a board certified advanced clinical practitioner to complete your personal care plan. Depending upon the condition, your plan could have included both over the counter or prescription medications.  Please review your pharmacy choice. Make sure the pharmacy is open so you can pick up prescription now. If there is a problem, you may contact your provider through [employer] and have the prescription routed to another pharmacy.  Your safety is important to [place]. If you have drug allergies check your prescription carefully.   For the next 24 hours you can use MyChart to ask questions about today's visit, request a non-urgent call back, or ask for a work or school excuse. You will get an email in the next two days asking about your experience. I hope that your e-visit has been valuable and will speed your recovery.  I provided 5 minutes of non face-to-face time during this encounter for chart review, medication and order placement, as well as and documentation.

## 2022-12-05 DIAGNOSIS — H5213 Myopia, bilateral: Secondary | ICD-10-CM | POA: Diagnosis not present

## 2022-12-24 DIAGNOSIS — D2272 Melanocytic nevi of left lower limb, including hip: Secondary | ICD-10-CM | POA: Diagnosis not present

## 2022-12-24 DIAGNOSIS — L308 Other specified dermatitis: Secondary | ICD-10-CM | POA: Diagnosis not present

## 2022-12-24 DIAGNOSIS — D485 Neoplasm of uncertain behavior of skin: Secondary | ICD-10-CM | POA: Diagnosis not present

## 2022-12-24 DIAGNOSIS — D225 Melanocytic nevi of trunk: Secondary | ICD-10-CM | POA: Diagnosis not present

## 2022-12-24 DIAGNOSIS — Z1283 Encounter for screening for malignant neoplasm of skin: Secondary | ICD-10-CM | POA: Diagnosis not present

## 2023-01-28 IMAGING — MR MR CERVICAL SPINE W/O CM
4 of 5 series · 19 of 48 positions shown · non-contrast
Comparison: None.

CLINICAL DATA: Left shoulder pain and paresthesia.

EXAM:
MRI CERVICAL SPINE WITHOUT CONTRAST
TECHNIQUE: Multiplanar, multisequence MR imaging of the cervical spine was
performed. No intravenous contrast was administered.

[Series 2: T2 · sagittal · 3.0mm · 0.43mm/px · 5 of 16 slices shown (1 of 2)]
[im 1/16]
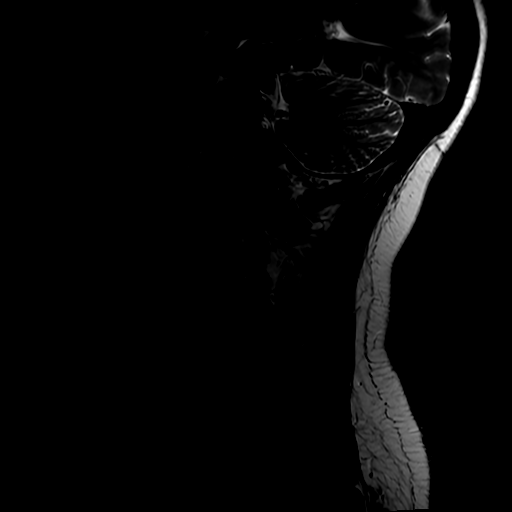
[im 4/16]
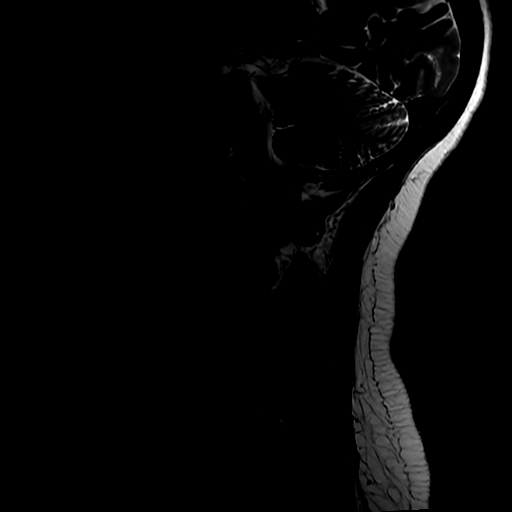
[im 8/16]
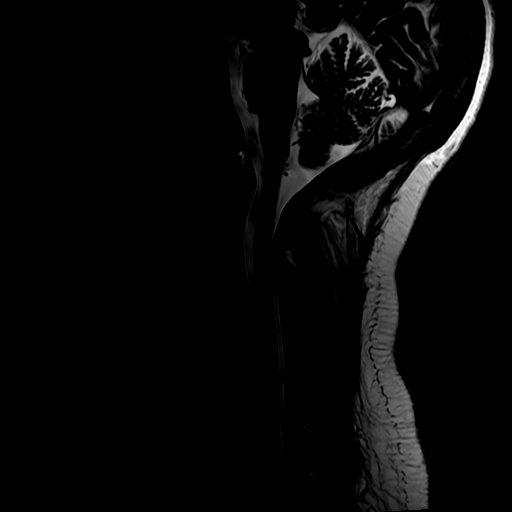
[im 12/16]
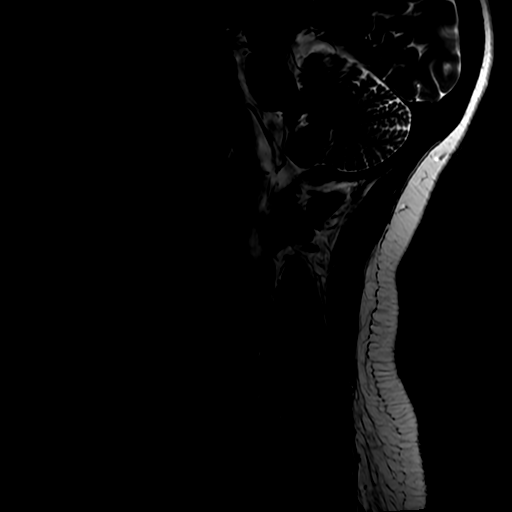
[im 16/16]
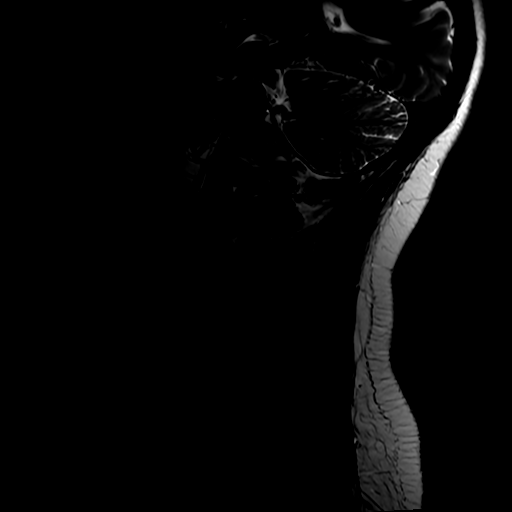

[Series 3: FLAIR · sagittal · 3.0mm · 0.43mm/px · 3 of 16 slices shown]
[im 1/16]
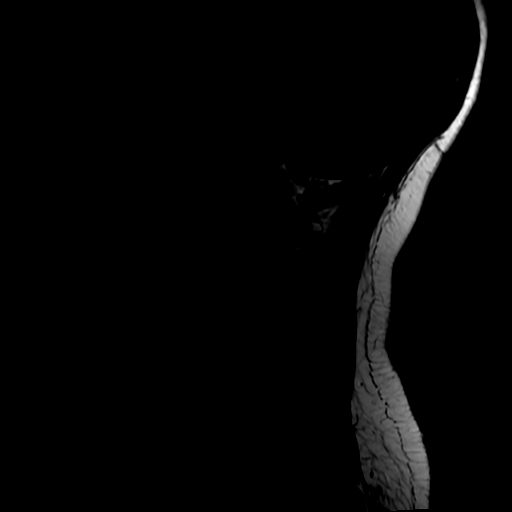
[im 11/16]
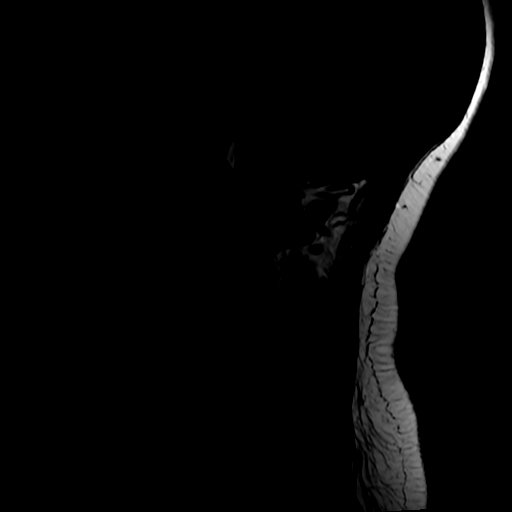
[im 16/16]
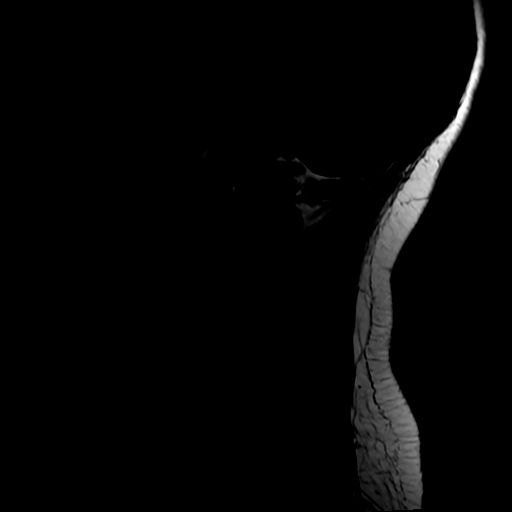

[Series 4: STIR · sagittal · 3.0mm · 0.43mm/px · 3 of 16 slices shown]
[im 1/16]
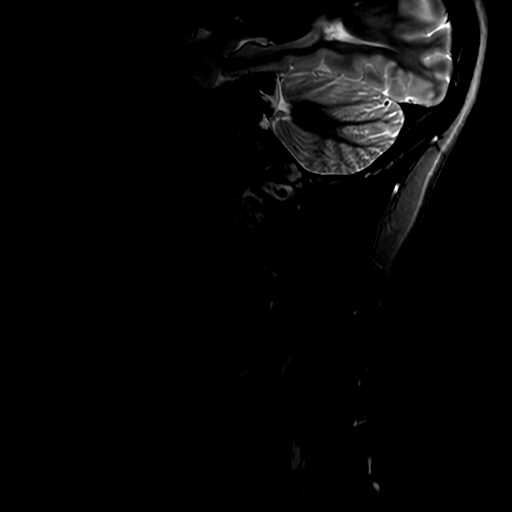
[im 11/16]
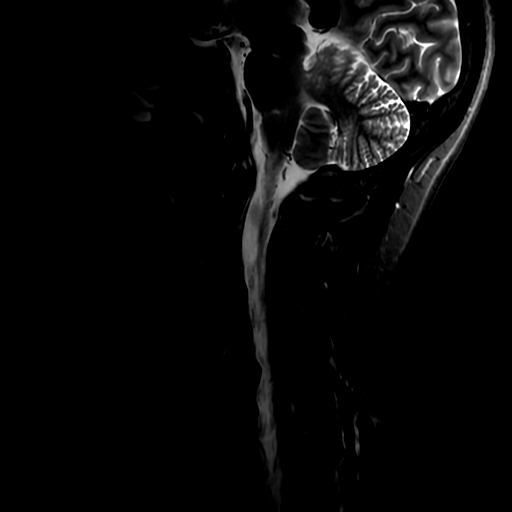
[im 16/16]
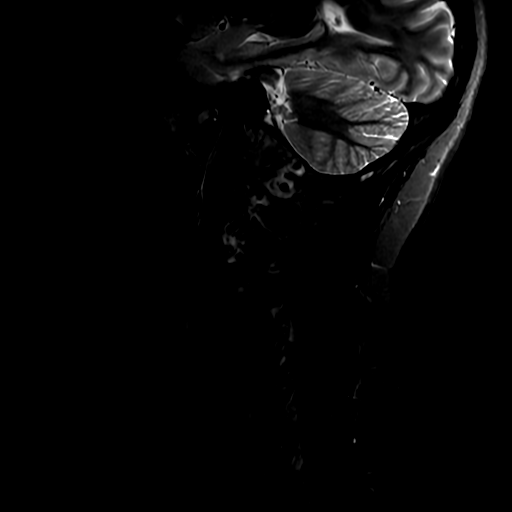

[Series 6: T2 · axial · 3.0mm · 0.35mm/px · z∈[-106,-18]mm · 8 of 34 slices shown (2 of 2)]
[im 1/34]
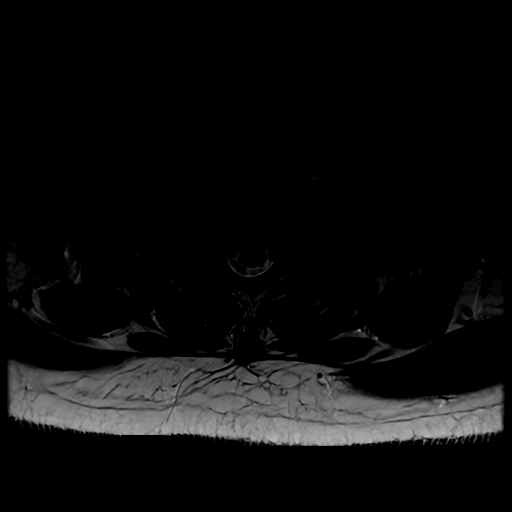
[im 5/34]
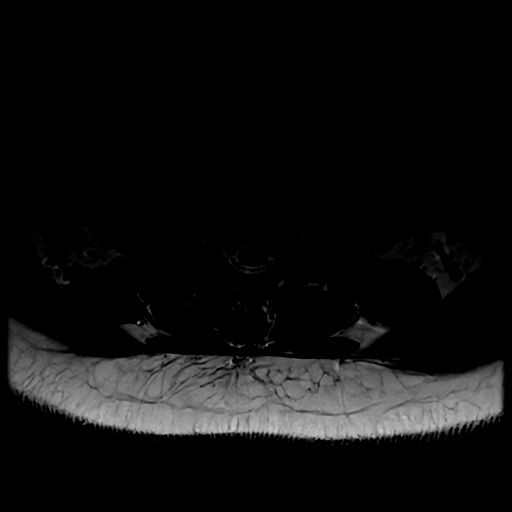
[im 9/34]
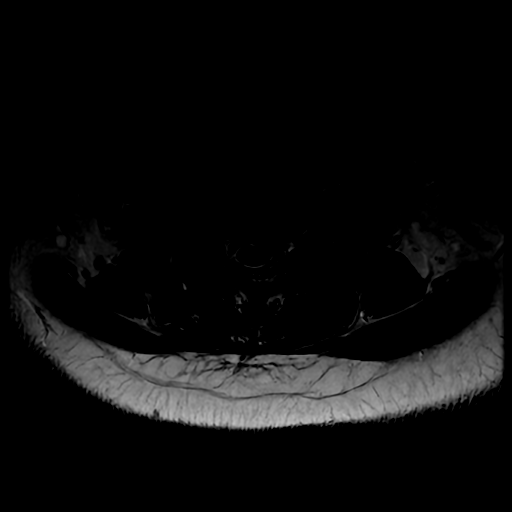
[im 13/34]
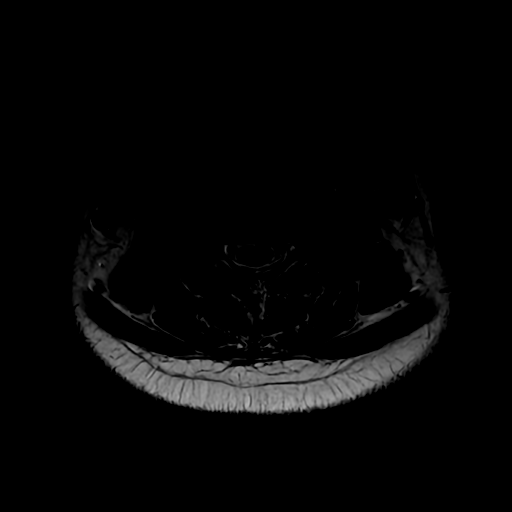
[im 17/34]
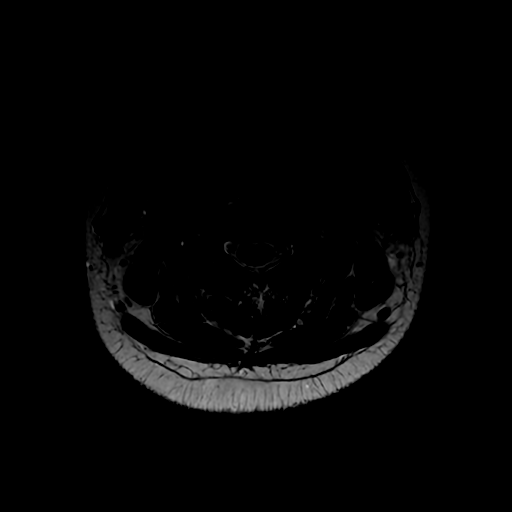
[im 21/34]
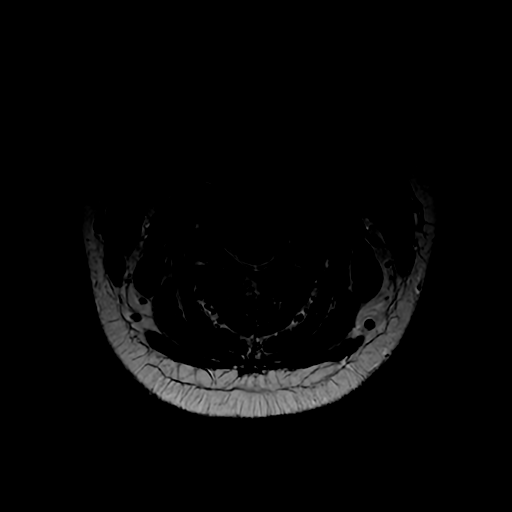
[im 25/34]
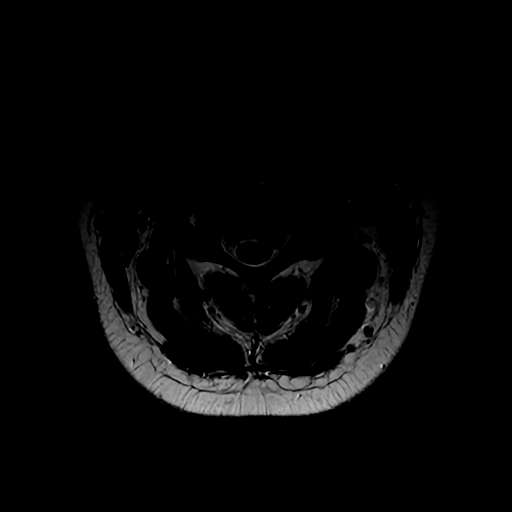
[im 29/34]
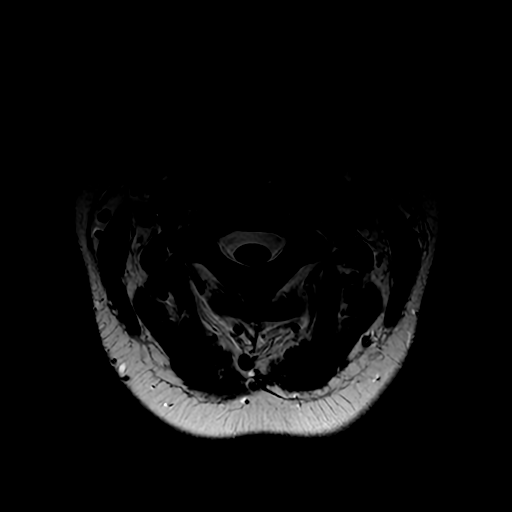

[19 of 48 positions shown; findings below may reference images not displayed]

FINDINGS: Alignment: Straightening of the normal cervical lordosis.

Vertebrae: No fracture or primary bone lesion.

Cord: No cord compression or primary cord lesion. No sign of
demyelinating disease.

Posterior Fossa, vertebral arteries, paraspinal tissues: Negative

Disc levels:

Foramen magnum is widely patent.  C1-2 and C2-3 are normal.

C3-4: Minimal bilateral uncovertebral prominence. No compressive
narrowing of the canal or foramina.

C4-5: Mild left-sided uncovertebral prominence. Mild left foraminal
narrowing, not likely compressive.

C5-6: Bilateral uncovertebral prominence. Small right posterolateral
disc protrusion. Proximal foraminal narrowing on the right that
could possibly affect the right C6 nerve. Mild foraminal narrowing
on the left, not grossly compressive, but with some potential to
affect the left C6 nerve.

C6-7: Normal interspace.

C7-T1: Normal interspace.
IMPRESSION: 1. C5-6: Bilateral uncovertebral prominence. Small right
posterolateral disc protrusion. Proximal foraminal narrowing on the
right that could possibly affect the right C6 nerve. Mild foraminal
narrowing on the left, not grossly compressive, but with some
potential to affect the left C6 nerve.
2. C4-5: Mild left-sided uncovertebral prominence with mild left
foraminal narrowing, not likely compressive.

## 2023-02-19 DIAGNOSIS — Z309 Encounter for contraceptive management, unspecified: Secondary | ICD-10-CM | POA: Diagnosis not present

## 2023-02-19 DIAGNOSIS — Z30011 Encounter for initial prescription of contraceptive pills: Secondary | ICD-10-CM | POA: Diagnosis not present

## 2023-02-20 DIAGNOSIS — Z3201 Encounter for pregnancy test, result positive: Secondary | ICD-10-CM | POA: Diagnosis not present

## 2023-02-27 DIAGNOSIS — Z3169 Encounter for other general counseling and advice on procreation: Secondary | ICD-10-CM | POA: Diagnosis not present

## 2023-05-28 DIAGNOSIS — M5412 Radiculopathy, cervical region: Secondary | ICD-10-CM | POA: Diagnosis not present

## 2023-05-28 DIAGNOSIS — M546 Pain in thoracic spine: Secondary | ICD-10-CM | POA: Diagnosis not present

## 2023-05-28 DIAGNOSIS — M9901 Segmental and somatic dysfunction of cervical region: Secondary | ICD-10-CM | POA: Diagnosis not present

## 2023-05-28 DIAGNOSIS — M9902 Segmental and somatic dysfunction of thoracic region: Secondary | ICD-10-CM | POA: Diagnosis not present

## 2023-06-17 ENCOUNTER — Encounter: Payer: Self-pay | Admitting: Sports Medicine

## 2023-06-17 ENCOUNTER — Ambulatory Visit (INDEPENDENT_AMBULATORY_CARE_PROVIDER_SITE_OTHER): Payer: 59 | Admitting: Sports Medicine

## 2023-06-17 ENCOUNTER — Other Ambulatory Visit (INDEPENDENT_AMBULATORY_CARE_PROVIDER_SITE_OTHER): Payer: 59

## 2023-06-17 DIAGNOSIS — M542 Cervicalgia: Secondary | ICD-10-CM

## 2023-06-17 DIAGNOSIS — M899 Disorder of bone, unspecified: Secondary | ICD-10-CM | POA: Diagnosis not present

## 2023-06-17 DIAGNOSIS — M62838 Other muscle spasm: Secondary | ICD-10-CM | POA: Diagnosis not present

## 2023-06-17 DIAGNOSIS — G8929 Other chronic pain: Secondary | ICD-10-CM

## 2023-06-17 DIAGNOSIS — M25512 Pain in left shoulder: Secondary | ICD-10-CM | POA: Diagnosis not present

## 2023-06-17 MED ORDER — BUPIVACAINE HCL 0.25 % IJ SOLN
1.0000 mL | INTRAMUSCULAR | Status: AC | PRN
  Administered 2023-06-17: 1 mL

## 2023-06-17 MED ORDER — LIDOCAINE HCL 1 % IJ SOLN
1.0000 mL | INTRAMUSCULAR | Status: AC | PRN
  Administered 2023-06-17: 1 mL

## 2023-06-17 MED ORDER — METHYLPREDNISOLONE 4 MG PO TBPK: ORAL_TABLET | ORAL | 0 refills | Status: DC

## 2023-06-17 MED ORDER — METHYLPREDNISOLONE ACETATE 40 MG/ML IJ SUSP
40.0000 mg | INTRAMUSCULAR | Status: AC | PRN
  Administered 2023-06-17: 40 mg via INTRAMUSCULAR

## 2023-06-17 NOTE — Progress Notes (Signed)
Bonnie Hughes - 32 y.o. female MRN [id number]  Date of birth: [birth year]  Office Visit Note: Visit Date: 06/17/2023 PCP: Junie Spencer, FNP Referred by: Junie Spencer, FNP  Subjective: Chief Complaint  Patient presents with   Neck - Pain   HPI: Bonnie Hughes is a pleasant 32 y.o. female who presents today for left-sided neck pain and trapezius pain with LUE radicular symptoms.  Had somewhat similar episodes back in 2022 with tightness in the neck and left shoulder with radiating pain.  Attributed this to holding her 10 pound baby with prolonged nursing.  Did do formalized physical therapy at that time and had complete resolution of her pain for the last 2-3 years.  Here recently a little over a month ago she woke with a crick in her neck.  Thought it would go away on its own with ibuprofen and Tylenol but it has persisted. The pain radiates into the left trapezius.  Also having some numbness and tingling that goes down the arm more so into the fourth and fifth finger.  Reports tightness and restriction with the left shoulder and scapula but no true weakness.  Has seen a chiropractor for about 10 visits without much relief.  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  She is left-hand dominant. Current medications tried: Nabumentone, Ibuprofen (helps somewhat)  Pertinent ROS were reviewed with the patient and found to be negative unless otherwise specified above in HPI.   Assessment & Plan: Visit Diagnoses:  1. Cervical pain   2. Trapezius muscle spasm   3. Scapular dysfunction   4. Chronic left shoulder pain    Plan: Discussed with Bonnie Hughes the possible etiologies of her neck and shoulder/scapular pain.  She is reporting some radicular symptoms going down the left shoulder and arm into the fingers, but at this time I am less suspicious of this being true cervical radiculopathy. She has a very hypertonic trapezius and left-sided cervical paraspinal  musculature.  There is also scapular dyskinesia on the left which I think is more contributing to her symptoms which may be irritating the plexus due to her postural abnormality. DTRs and strength testing are preserved today.  Through shared decision-making, did proceed with trigger point injections into the left trapezius and medial scapular border, patient tolerated well.  We will start her on a 6-day methylprednisolone taper pack to help with her acute symptoms.  She received excellent relief in the past from formalized physical therapy, referral sent today.  I did provide her with cervical isometrics and scapular stabilization exercises with a handout she may start in the interim prior to starting PT.  Would like to see how she responds over the coming 6 weeks, I do think she would improve with these modalities.  If for some when she is not and she is still experiencing true radicular symptoms down the arm and into the fingers, next step may be considering MRI of the cervical spine.  She will keep me updated.  Follow-up: Return if symptoms worsen or fail to improve, for message me for update around 4-6 weeks .   Meds & Orders:  Meds ordered this encounter  Medications   methylPREDNISolone (MEDROL DOSEPAK) 4 MG TBPK tablet    Sig: Take per packet instructions    Dispense:  1 each    Refill:  0    Orders Placed This Encounter  Procedures   Trigger Point Inj   XR Cervical Spine 2  or 3 views   Ambulatory referral to Physical Therapy     Procedures: Trigger Point Inj  Date/Time: 06/17/2023 5:41 PM  Performed by: Madelyn Brunner, DO Authorized by: Madelyn Brunner, DO   Consent Given by:  Patient Site marked: the procedure site was marked   Timeout: prior to procedure the correct patient, procedure, and site was verified   Indications:  Pain and therapeutic Total # of Trigger Points:  2 Location: neck and shoulder   Needle Size:  27 G Approach:  Dorsal Medications #1:  1 mL lidocaine 1 %; 1 mL  bupivacaine 0.25 %; 40 mg methylPREDNISolone acetate 40 MG/ML Medications #2:  1 mL lidocaine 1 %; 1 mL bupivacaine 0.25 % Patient tolerance:  Patient tolerated the procedure well with no immediate complications Comments: Procedure: Trigger point injections (2), left trapezius and medial scapular border After discussion on R/B/I and informed verbal consent was obtained, a timeout was conducted. The patient was placed in a prone position on the examination table and the area of maximal tenderness was identified over the left trapezius and medial scapular border. This area was cleansed with multiple alcohol swabs. Ethyl chloride was used for local anesthesia. Using a 27-gauge 1.5 inch needle the trigger point(s) was subsequently injected with a mixture of 1 cc of methylprednisolone 40 mg/mL and 2 cc of 1% lidocaine without epinephrine and 2 cc of bupivicaine 0.25%, with a total of 2.5 cc of injectate into each trigger point. A band-aid was applied following. Patient tolerated procedure well, there were no post-injection complications. Post-procedure instructions were given.         Clinical History: No specialty comments available.  She reports that she has never smoked. She has never used smokeless tobacco. No results for input(s): "HGBA1C", "LABURIC" in the last 8760 hours.  Objective:   Vital Signs: There were no vitals taken for this visit.  Physical Exam  Gen: Well-appearing, in no acute distress; non-toxic CV: Regular Rate. Well-perfused. Warm.  Resp: Breathing unlabored on room air; no wheezing. Psych: Fluid speech in conversation; appropriate affect; normal thought process Neuro: Sensation intact throughout. No gross coordination deficits.   Ortho Exam - Cervical: No midline spinous process TTP.  There is full range of motion with flexion and extension although mild reproduction of pain with extension.  Equivocal Spurling's test bilaterally.  There is notable left-sided cervical  paraspinal hypertonicity and significant left trapezius increased muscle tone and hypertonicity.  Associated trigger point in this region.  5/5 strength of bilateral upper extremities in the C4-C7 myotome.  2+ biceps and Achilles DTR.  - Left shoulder/Scapula: No swelling or redness.  No bony TTP.  There is fluid internal and external rotation about the shoulder.  There is mild restriction at endrange flexion and abduction with scapular dyskinesia noted.  There is no true scapular winging but certainly dyskinetic motion and associated hypertonicity and trigger points within the medial scapular border on the left greater than right. Bilateral shoulder protraction noted.   Imaging: XR Cervical Spine 2 or 3 views  Result Date: 06/17/2023 3 views of the cervical spine including AP, lateral and flexion/extension views were ordered and reviewed by myself today.  X-rays demonstrate preserved intervertebral disc spaces without significant arthritic change.  There is mild flattening of the normal cervical lordotic curve.  There is no Antero/retrograde listhesis.  No acute fracture or other bony abnormality noted.   *Independent review and interpretation of cervical spine MRI from 03/14/2021 was performed by myself today.  MRI  demonstrates a posterior lateral disc protrusion at the C5-C6 level, with mild protrusion overlying the right C6 nerve root without gross impingement.  I cannot appreciate compressive impingement on the left side.  Otherwise rather benign cervical MRI.  Narrative & Impression  CLINICAL DATA:  Left shoulder pain and paresthesia.   EXAM: MRI CERVICAL SPINE WITHOUT CONTRAST   TECHNIQUE: Multiplanar, multisequence MR imaging of the cervical spine was performed. No intravenous contrast was administered.   COMPARISON:  None.   FINDINGS: Alignment: Straightening of the normal cervical lordosis.   Vertebrae: No fracture or primary bone lesion.   Cord: No cord compression or primary cord  lesion. No sign of demyelinating disease.   Posterior Fossa, vertebral arteries, paraspinal tissues: Negative   Disc levels:   Foramen magnum is widely patent.  C1-2 and C2-3 are normal.   C3-4: Minimal bilateral uncovertebral prominence. No compressive narrowing of the canal or foramina.   C4-5: Mild left-sided uncovertebral prominence. Mild left foraminal narrowing, not likely compressive.   C5-6: Bilateral uncovertebral prominence. Small right posterolateral disc protrusion. Proximal foraminal narrowing on the right that could possibly affect the right C6 nerve. Mild foraminal narrowing on the left, not grossly compressive, but with some potential to affect the left C6 nerve.   C6-7: Normal interspace.   C7-T1: Normal interspace.   IMPRESSION: 1. C5-6: Bilateral uncovertebral prominence. Small right posterolateral disc protrusion. Proximal foraminal narrowing on the right that could possibly affect the right C6 nerve. Mild foraminal narrowing on the left, not grossly compressive, but with some potential to affect the left C6 nerve. 2. C4-5: Mild left-sided uncovertebral prominence with mild left foraminal narrowing, not likely compressive.     Electronically Signed   By: Paulina Fusi M.D.   On: 03/14/2021 15:36    Past Medical/Family/Surgical/Social History: Medications & Allergies reviewed per EMR, new medications updated. Patient Active Problem List   Diagnosis Date Noted   Cesarean delivery, delivered, current hospitalization 04/25/2020   Abnormal glucose tolerance test (GTT) during pregnancy, antepartum 03/04/2020   Pregnant 10/13/2019   Overweight (BMI 25.0-29.9) 06/24/2019   Irregular periods 12/30/2017   Chronic migraine without aura without status migrainosus, not intractable 01/20/2016   Past Medical History:  Diagnosis Date   Gestational diabetes    Heart murmur    Vaginal Pap smear, abnormal    Family History  Problem Relation Age of Onset    Hyperlipidemia Father    Hypertension Father    Anemia Maternal Grandmother    Heart disease Maternal Grandmother    Hyperlipidemia Paternal Grandmother    Hypertension Paternal Grandmother    Heart disease Paternal Grandmother    Glaucoma Paternal Grandmother    Past Surgical History:  Procedure Laterality Date   CESAREAN SECTION N/A 04/25/2020   Procedure: CESAREAN SECTION;  Surgeon: Waynard Reeds, MD;  Location: [facility];  Service: Obstetrics;  Laterality: N/A;   COLPOSCOPY     Social History   Occupational History   Not on file  Tobacco Use   Smoking status: Never   Smokeless tobacco: Never  Vaping Use   Vaping Use: Never used  Substance and Sexual Activity   Alcohol use: No   Drug use: No   Sexual activity: Yes    Birth control/protection: Pill

## 2023-06-17 NOTE — Progress Notes (Signed)
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

## 2023-06-18 ENCOUNTER — Encounter: Payer: Self-pay | Admitting: Sports Medicine

## 2023-06-30 ENCOUNTER — Other Ambulatory Visit: Payer: Self-pay

## 2023-06-30 ENCOUNTER — Encounter: Payer: Self-pay | Admitting: Physical Therapy

## 2023-06-30 ENCOUNTER — Ambulatory Visit (INDEPENDENT_AMBULATORY_CARE_PROVIDER_SITE_OTHER): Payer: 59 | Admitting: Physical Therapy

## 2023-06-30 DIAGNOSIS — M25512 Pain in left shoulder: Secondary | ICD-10-CM

## 2023-06-30 DIAGNOSIS — M6281 Muscle weakness (generalized): Secondary | ICD-10-CM

## 2023-06-30 DIAGNOSIS — M542 Cervicalgia: Secondary | ICD-10-CM

## 2023-06-30 NOTE — Therapy (Signed)
OUTPATIENT PHYSICAL THERAPY CERVICAL/Shoulder EVALUATION   Patient Name: Bonnie Hughes MRN: [id number] DOB:25-Nov-1991, 32 y.o., female Today's Date: 06/30/2023  END OF SESSION:  PT End of Session - 06/30/23 1610     Visit Number 1    Number of Visits 10    Date for PT Re-Evaluation 08/25/23    PT Start Time 1430    PT Stop Time 1516    PT Time Calculation (min) 46 min    Activity Tolerance Patient tolerated treatment well    Behavior During Therapy [facility] for tasks assessed/performed             Past Medical History:  Diagnosis Date   Gestational diabetes    Heart murmur    Vaginal Pap smear, abnormal    Past Surgical History:  Procedure Laterality Date   CESAREAN SECTION N/A 04/25/2020   Procedure: CESAREAN SECTION;  Surgeon: Waynard Reeds, MD;  Location: [facility] ORS;  Service: Obstetrics;  Laterality: N/A;   COLPOSCOPY     Patient Active Problem List   Diagnosis Date Noted   Cesarean delivery, delivered, current hospitalization 04/25/2020   Abnormal glucose tolerance test (GTT) during pregnancy, antepartum 03/04/2020   Pregnant 10/13/2019   Overweight (BMI 25.0-29.9) 06/24/2019   Irregular periods 12/30/2017   Chronic migraine without aura without status migrainosus, not intractable 01/20/2016    PCP: Junie Spencer, FNP   REFERRING PROVIDER: Madelyn Brunner, DO  REFERRING DIAG: M54.2 (ICD-10-CM) - Cervical pain Neck/ trapezius and scapular pain  THERAPY DIAG:  Acute pain of left shoulder  Cervicalgia  Muscle weakness (generalized)  Rationale for Evaluation and Treatment: Rehabilitation  ONSET DATE: subacute onset of pain  SUBJECTIVE:                                                                                                                                                                                                         SUBJECTIVE STATEMENT: presents today for left-sided neck pain and trapezius pain with LUE radicular symptoms. She had episode  of this a few years ago after nursing baby and this did resolve with PT. She says she can tell she has some weakness in her left arm some. She did try chiropractor care and this really did not help very much. She did some pain relief with injection  Hand dominance: Left  PERTINENT HISTORY:  Previous episode of this 2 years ago, bulging disc noted on cervical MRI  PAIN:  Are you having pain? Yes: NPRS scale: 2/10 Pain location: left deltoid Pain description: numbness/tingling, static Aggravating factors: looking  down, compute work, cleaning Relieving factors: injection, changing posture, IB profen  PRECAUTIONS: None  RED FLAGS: None     WEIGHT BEARING RESTRICTIONS: No  FALLS:  Has patient fallen in last 6 months? No  OCCUPATION: nurse  PLOF: Independent  PATIENT GOALS: reduce pain  NEXT MD VISIT: nothing scheduled  OBJECTIVE:   DIAGNOSTIC FINDINGS:  Imaging: XR Cervical Spine 2 or 3 views   Results summary per Dr. Shon Baton Result Date: 06/17/2023 "3 views of the cervical spine including AP, lateral and flexion/extension views were ordered and reviewed by myself today.  X-rays demonstrate preserved intervertebral disc spaces without significant arthritic change.  There is mild flattening of the normal cervical lordotic curve.  There is no Antero/retrograde listhesis.  No acute fracture or other bony abnormality noted. "   *Independent review and interpretation of cervical spine MRI from 03/14/2021 was performed by myself today.  MRI demonstrates a posterior lateral disc protrusion at the C5-C6 level, with mild protrusion overlying the right C6 nerve root without gross impingement.  I cannot appreciate compressive impingement on the left side.  Otherwise rather benign cervical MRI.  PATIENT SURVEYS:  Eval: FOTO 58% functional, goal is 74%  COGNITION: Overall cognitive status: Within functional limits for tasks assessed  SENSATION: WFL  POSTURE: rounded  shoulders     CERVICAL ROM:   Active ROM A/PROM (deg) eval  Flexion 30  Extension 35  Right lateral flexion 30  Left lateral flexion 40  Right rotation WNL  Left rotation WNL   (Blank rows = not tested)  UPPER EXTREMITY ROM:  Active ROM Right eval Left eval  Shoulder flexion    Shoulder extension    Shoulder abduction    Shoulder adduction    Shoulder extension    Shoulder internal rotation    Shoulder external rotation    Elbow flexion    Elbow extension    Wrist flexion    Wrist extension    Wrist ulnar deviation    Wrist radial deviation    Wrist pronation    Wrist supination     (Blank rows = not tested)  UPPER EXTREMITY MMT:  MMT Right eval Left eval  Shoulder flexion 5 4  Shoulder extension 5 3+  Shoulder abduction 5 3+  Shoulder adduction    Shoulder extension    Shoulder internal rotation 5 4  Shoulder external rotation 5 4  Middle trapezius 4 3  Lower trapezius 4 3  Elbow flexion    Elbow extension    Wrist flexion    Wrist extension    Wrist ulnar deviation    Wrist radial deviation    Wrist pronation    Wrist supination    Grip strength 84# avg 61.5# avg   (Blank rows = not tested)  CERVICAL SPECIAL TESTS:  Expresses some relief with manual traction, + upper limb nerve tension tests for ulnar and radial, negative for median  FUNCTIONAL TESTS:    TODAY'S TREATMENT:  Eval HEP creation and review with demonstration and trial set preformed, see below for details Mechanical cervical traction 19-14# intermittent X 15 min   PATIENT EDUCATION: Education details: HEP, PT plan of care Person educated: Patient Education method: Explanation, Demonstration, Verbal cues, and Handouts Education comprehension: verbalized understanding and needs further education   HOME EXERCISE PROGRAM: Access Code: [email or web address] URL: [email or web address] Date: 06/30/2023 Prepared by: Ivery Quale  Exercises - Seated Passive Cervical  Retraction  - 5 x daily - 7 x weekly - 1 sets -  10 reps - 3 sec hold - Seated Cervical Retraction and Extension  - 5 x daily - 7 x weekly - 1 sets - 10 reps - 3 sec hold - Wall Push Up with Plus  - 1 x daily - 5 x weekly - 1 sets - 15-20 reps - Standing Shoulder Internal Rotation Stretch with Towel (Mirrored)  - 1 x daily - 5 x weekly - 1 sets - 5 reps - 10 sec hold - Shoulder External Rotation and Scapular Retraction with Resistance  - 1 x daily - 5 x weekly - 2 sets - 15-20 reps - Prone Single Arm Shoulder Y with Dumbbell (Mirrored)  - 1 x daily - 5 x weekly - 2 sets - 10 reps - Prone Single Arm Shoulder Horizontal Abduction with Dumbbell (Mirrored)  - 1 x daily - 5 x weekly - 2 sets - 10 reps - Prone Shoulder Extension - Single Arm (Mirrored)  - 1 x daily - 5 x weekly - 2 sets - 10 reps - 3 sec hold - Standing Shoulder Flexion with Resistance (Mirrored)  - 1 x daily - 5 x weekly - 3 sets - 10 reps - Standing Single Arm Shoulder Abduction with Resistance  - 1 x daily - 5 x weekly - 3 sets - 10 reps  ASSESSMENT:  CLINICAL IMPRESSION: Patient referred to PT for Lt shoulder pain and weakness that presents as cervical radiculopathy. I did try mechanical cervical traction today with her. Patient will benefit from skilled PT to address below impairments, limitations and improve overall function.  OBJECTIVE IMPAIRMENTS: decreased activity tolerance, decreased shoulder/neck mobility, decreased ROM, decreased strength, impaired UE use, postural dysfunction, and pain.  ACTIVITY LIMITATIONS: reaching, lifting, carry,  cleaning, driving, and or occupation  PERSONAL FACTORS:  cervical bulging disc also affecting patient's functional outcome.  REHAB POTENTIAL: Good  CLINICAL DECISION MAKING: Stable/uncomplicated  EVALUATION COMPLEXITY: Low    GOALS: Short term PT Goals Target date: 07/28/2023   Pt will be I and compliant with HEP. Baseline:  Goal status: New Pt will decrease pain by 25%  overall Baseline: Goal status: New  Long term PT goals Target date:08/25/2023   Pt will improve Lt shoulder AROM to [facility] to improve functional reaching Baseline: Goal status: New Pt will improve  Lt shoulder strength to at least 4+/5 MMT to improve functional strength Baseline: Goal status: New Pt will improve FOTO to at least 74% functional to show improved function Baseline: Goal status: New Pt will reduce pain to overall less than 2/10 with usual activity and work activity. Baseline: Goal status: New  PLAN: PT FREQUENCY: 1-2 times per week   PT DURATION: 12 weeks  PLANNED INTERVENTIONS (unless contraindicated): aquatic PT, Canalith repositioning, cryotherapy, Electrical stimulation, Iontophoresis with 4 mg/ml dexamethasome, Moist heat, traction, Ultrasound, gait training, Therapeutic exercise, balance training, neuromuscular re-education, patient/family education, prosthetic training, manual techniques, passive ROM, dry needling, taping, vasopnuematic device, vestibular, spinal manipulations, joint manipulations  PLAN FOR NEXT SESSION: review HEP, how was traction? Consider DN, needs left shoulder strengthening and grip strength    April Manson, PT,DPT 06/30/2023, 4:11 PM

## 2023-07-31 ENCOUNTER — Encounter: Payer: 59 | Admitting: Physical Therapy

## 2023-09-23 DIAGNOSIS — N6311 Unspecified lump in the right breast, upper outer quadrant: Secondary | ICD-10-CM | POA: Diagnosis not present

## 2023-09-24 ENCOUNTER — Other Ambulatory Visit: Payer: Self-pay | Admitting: Obstetrics

## 2023-09-24 DIAGNOSIS — N6320 Unspecified lump in the left breast, unspecified quadrant: Secondary | ICD-10-CM

## 2023-10-02 ENCOUNTER — Ambulatory Visit (HOSPITAL_BASED_OUTPATIENT_CLINIC_OR_DEPARTMENT_OTHER): Payer: 59 | Admitting: Family Medicine

## 2023-10-06 ENCOUNTER — Ambulatory Visit (HOSPITAL_BASED_OUTPATIENT_CLINIC_OR_DEPARTMENT_OTHER): Payer: 59 | Admitting: Family Medicine

## 2023-10-06 ENCOUNTER — Encounter (HOSPITAL_BASED_OUTPATIENT_CLINIC_OR_DEPARTMENT_OTHER): Payer: Self-pay | Admitting: Family Medicine

## 2023-10-06 ENCOUNTER — Other Ambulatory Visit (HOSPITAL_BASED_OUTPATIENT_CLINIC_OR_DEPARTMENT_OTHER): Payer: Self-pay

## 2023-10-06 VITALS — BP 123/51 | HR 75 | Ht 68.0 in | Wt 207.0 lb

## 2023-10-06 DIAGNOSIS — Z7689 Persons encountering health services in other specified circumstances: Secondary | ICD-10-CM | POA: Diagnosis not present

## 2023-10-06 DIAGNOSIS — E6609 Other obesity due to excess calories: Secondary | ICD-10-CM | POA: Diagnosis not present

## 2023-10-06 DIAGNOSIS — Z6831 Body mass index (BMI) 31.0-31.9, adult: Secondary | ICD-10-CM | POA: Diagnosis not present

## 2023-10-06 DIAGNOSIS — Z0000 Encounter for general adult medical examination without abnormal findings: Secondary | ICD-10-CM

## 2023-10-06 DIAGNOSIS — E66811 Obesity, class 1: Secondary | ICD-10-CM | POA: Diagnosis not present

## 2023-10-06 NOTE — Patient Instructions (Signed)

## 2023-10-06 NOTE — Progress Notes (Signed)
New Patient Office Visit  Subjective    Patient ID: Bonnie Hughes, female    DOB: 28-Oct-1991  Age: 32 y.o. MRN: [id number]  CC:  Chief Complaint  Patient presents with   Weight Loss   HPI Bonnie Hughes is a 32 year old female who presents to establish with Primary Care & Sports Medicine at [facility].  Former PCP: Jannifer Rodney, FNP Last physical: about 3 years ago, before having her child    She has concerned about losing weight. She reports having a difficult time getting into an exercise routine. She was walking daily after dinner but now it is getting darker and colder outside earlier. She is not very good at "sticking to diets" and finds it difficult to meal prep and eat the same thing every day.   Prior to visit, she noticed a small red bump on the lateral side of her R breast. She ended up getting an appt with her OBGYN, Dr. Chestine Spore at [facility], who ordered US/mammogram for this upcoming Friday. She reports it was a hard nodule that was pea-sized and felt like it was just under the skin. Pap smear is scheduled for 10/2023.  Records request sent today.    Outpatient Encounter Medications as of 10/06/2023  Medication Sig   ESTARYLLA 0.25-35 MG-MCG tablet Take by mouth.   [DISCONTINUED] Ferrous Sulfate (IRON PO) Take by mouth.   [DISCONTINUED] albuterol (VENTOLIN HFA) 108 (90 Base) MCG/ACT inhaler Inhale 2 puffs into the lungs every 6 (six) hours as needed for wheezing or shortness of breath. (Patient not taking: Reported on 10/06/2023)   [DISCONTINUED] Bacillus Coagulans-Inulin (PROBIOTIC) 1-250 BILLION-MG CAPS Take 1 capsule by mouth daily.  (Patient not taking: Reported on 01/24/2021)   [DISCONTINUED] benzonatate (TESSALON) 100 MG capsule Take 1 capsule (100 mg total) by mouth 3 (three) times daily as needed for cough. (Patient not taking: Reported on 10/06/2023)   [DISCONTINUED] brompheniramine-pseudoephedrine-DM 30-2-10 MG/5ML syrup Take 5 mLs by mouth 4  (four) times daily as needed. (Patient not taking: Reported on 10/06/2023)   [DISCONTINUED] methylPREDNISolone (MEDROL DOSEPAK) 4 MG TBPK tablet Take per packet instructions (Patient not taking: Reported on 10/06/2023)   [DISCONTINUED] nabumetone (RELAFEN) 500 MG tablet Take 1 tablet (500 mg total) by mouth 2 (two) times daily as needed.   [DISCONTINUED] paragard intrauterine copper IUD IUD ParaGard T 380A 380 square mm intrauterine device  Take 1 device as needed by intrauterine route.   [DISCONTINUED] promethazine-dextromethorphan (PROMETHAZINE-DM) 6.25-15 MG/5ML syrup Take 5 mLs by mouth 3 (three) times daily as needed for cough. (Patient not taking: Reported on 10/06/2023)   No facility-administered encounter medications on file as of 10/06/2023.    Past Medical History:  Diagnosis Date   Gestational diabetes    Heart murmur    Vaginal Pap smear, abnormal     Past Surgical History:  Procedure Laterality Date   CESAREAN SECTION N/A 04/25/2020   Procedure: CESAREAN SECTION;  Surgeon: Waynard Reeds, MD;  Location: [facility] LD ORS;  Service: Obstetrics;  Laterality: N/A;   COLPOSCOPY      Family History  Problem Relation Age of Onset   Hyperlipidemia Father    Hypertension Father    Anemia Maternal Grandmother    Heart disease Maternal Grandmother    Hyperlipidemia Paternal Grandmother    Hypertension Paternal Grandmother    Heart disease Paternal Grandmother    Glaucoma Paternal Grandmother      Review of Systems  Constitutional:  Negative for malaise/fatigue.  HENT:  Negative for ear pain and tinnitus.   Eyes:  Negative for blurred vision and double vision.  Respiratory:  Negative for cough and shortness of breath.   Cardiovascular:  Negative for chest pain, palpitations and leg swelling.  Gastrointestinal:  Negative for abdominal pain, nausea and vomiting.  Musculoskeletal:  Negative for myalgias.  Neurological:  Negative for dizziness, weakness and headaches.   Psychiatric/Behavioral:  Negative for depression and suicidal ideas. The patient is not nervous/anxious and does not have insomnia.     Objective    BP (!) 123/51   Pulse 75   Ht 5\' 8"  (1.727 m)   Wt 207 lb (93.9 kg)   LMP 09/13/2023 (Exact Date)   SpO2 99%   Breastfeeding No   BMI 31.47 kg/m   Physical Exam Constitutional:      Appearance: Normal appearance.  Cardiovascular:     Rate and Rhythm: Normal rate and regular rhythm.     Pulses: Normal pulses.     Heart sounds: Normal heart sounds.  Pulmonary:     Effort: Pulmonary effort is normal.     Breath sounds: Normal breath sounds.  Neurological:     Mental Status: She is alert.  Psychiatric:        Mood and Affect: Mood normal.        Behavior: Behavior normal.     Assessment & Plan:   1. Encounter to establish care Patient is a 32 year old female who presents today to establish care with primary care at [facility]. Reviewed the past medical history, family history, social history, surgical history, medications and allergies today- updates made as indicated. Patient would like to discuss weight loss today.   2. Class 1 obesity due to excess calories without serious comorbidity with body mass index (BMI) of 31.0 to 31.9 in adult BMI today 31.47. Patient reports having difficult time sticking to an exercise routine and healthy nutrition. Discussed limits due to insurance coverage, and she is not necessarily interested in medication management. She would like a referral to [facility] at this time. Referral placed.  - Amb Ref to Medical Weight Management  3. Wellness examination Will obtain routine HCM labs and update patient with results at upcoming physical.  - CBC with Differential/Platelet - Comprehensive metabolic panel - Hemoglobin A1c - Lipid panel - TSH Rfx on Abnormal to Free T4 - VITAMIN D 25 Hydroxy (Vit-D Deficiency, Fractures)   Return in about 6 weeks (around 11/17/2023) for Physical.   Alyson Reedy, FNP

## 2023-10-07 LAB — CBC WITH DIFFERENTIAL/PLATELET
Basophils Absolute: 0 10*3/uL (ref 0.0–0.2)
Basos: 0 %
EOS (ABSOLUTE): 0.1 10*3/uL (ref 0.0–0.4)
Eos: 1 %
Hematocrit: 40.4 % (ref 34.0–46.6)
Hemoglobin: 13.2 g/dL (ref 11.1–15.9)
Immature Grans (Abs): 0 10*3/uL (ref 0.0–0.1)
Immature Granulocytes: 0 %
Lymphocytes Absolute: 2.4 10*3/uL (ref 0.7–3.1)
Lymphs: 33 %
MCH: 31.8 pg (ref 26.6–33.0)
MCHC: 32.7 g/dL (ref 31.5–35.7)
MCV: 97 fL (ref 79–97)
Monocytes Absolute: 0.5 10*3/uL (ref 0.1–0.9)
Monocytes: 7 %
Neutrophils Absolute: 4.1 10*3/uL (ref 1.4–7.0)
Neutrophils: 59 %
Platelets: 290 10*3/uL (ref 150–450)
RBC: 4.15 x10E6/uL (ref 3.77–5.28)
RDW: 12.3 % (ref 11.7–15.4)
WBC: 7.1 10*3/uL (ref 3.4–10.8)

## 2023-10-07 LAB — COMPREHENSIVE METABOLIC PANEL
ALT: 6 [IU]/L (ref 0–32)
AST: 11 [IU]/L (ref 0–40)
Albumin: 4 g/dL (ref 3.9–4.9)
Alkaline Phosphatase: 54 [IU]/L (ref 44–121)
BUN/Creatinine Ratio: 11 (ref 9–23)
BUN: 10 mg/dL (ref 6–20)
Bilirubin Total: 0.4 mg/dL (ref 0.0–1.2)
CO2: 21 mmol/L (ref 20–29)
Calcium: 9.1 mg/dL (ref 8.7–10.2)
Chloride: 106 mmol/L (ref 96–106)
Creatinine, Ser: 0.89 mg/dL (ref 0.57–1.00)
Globulin, Total: 2.7 g/dL (ref 1.5–4.5)
Glucose: 101 mg/dL — ABNORMAL HIGH (ref 70–99)
Potassium: 4.3 mmol/L (ref 3.5–5.2)
Sodium: 142 mmol/L (ref 134–144)
Total Protein: 6.7 g/dL (ref 6.0–8.5)
eGFR: 88 mL/min/{1.73_m2} (ref >59)

## 2023-10-07 LAB — LIPID PANEL
Chol/HDL Ratio: 3.1 ratio (ref 0.0–4.4)
Cholesterol, Total: 232 mg/dL — ABNORMAL HIGH (ref 100–199)
HDL: 75 mg/dL (ref >39)
LDL Chol Calc (NIH): 131 mg/dL — ABNORMAL HIGH (ref 0–99)
Triglycerides: 147 mg/dL (ref 0–149)
VLDL Cholesterol Cal: 26 mg/dL (ref 5–40)

## 2023-10-07 LAB — TSH RFX ON ABNORMAL TO FREE T4: TSH: 1.69 u[IU]/mL (ref 0.450–4.500)

## 2023-10-07 LAB — HEMOGLOBIN A1C
Est. average glucose Bld gHb Est-mCnc: 117 mg/dL
Hgb A1c MFr Bld: 5.7 % — ABNORMAL HIGH (ref 4.8–5.6)

## 2023-10-07 LAB — VITAMIN D 25 HYDROXY (VIT D DEFICIENCY, FRACTURES): Vit D, 25-Hydroxy: 53.7 ng/mL (ref 30.0–100.0)

## 2023-10-10 ENCOUNTER — Ambulatory Visit
Admission: RE | Admit: 2023-10-10 | Discharge: 2023-10-10 | Disposition: A | Discharge status: Home / Self Care | Payer: 59 | Source: Ambulatory Visit | Attending: Obstetrics

## 2023-10-10 ENCOUNTER — Ambulatory Visit
Admission: RE | Admit: 2023-10-10 | Discharge: 2023-10-10 | Disposition: A | Discharge status: Home / Self Care | Payer: 59 | Source: Ambulatory Visit | Attending: Obstetrics | Admitting: Obstetrics

## 2023-10-10 DIAGNOSIS — N6320 Unspecified lump in the left breast, unspecified quadrant: Secondary | ICD-10-CM

## 2023-10-10 DIAGNOSIS — N6311 Unspecified lump in the right breast, upper outer quadrant: Secondary | ICD-10-CM | POA: Diagnosis not present

## 2023-10-20 DIAGNOSIS — Z0289 Encounter for other administrative examinations: Secondary | ICD-10-CM

## 2023-10-21 ENCOUNTER — Encounter: Payer: Self-pay | Admitting: Family Medicine

## 2023-10-21 ENCOUNTER — Ambulatory Visit (INDEPENDENT_AMBULATORY_CARE_PROVIDER_SITE_OTHER): Payer: 59 | Admitting: Family Medicine

## 2023-10-21 VITALS — BP 113/78 | HR 75 | Temp 98.6°F | Ht 68.0 in | Wt 200.0 lb

## 2023-10-21 DIAGNOSIS — E66811 Obesity, class 1: Secondary | ICD-10-CM

## 2023-10-21 DIAGNOSIS — G43709 Chronic migraine without aura, not intractable, without status migrainosus: Secondary | ICD-10-CM

## 2023-10-21 DIAGNOSIS — R7303 Prediabetes: Secondary | ICD-10-CM | POA: Diagnosis not present

## 2023-10-21 DIAGNOSIS — Z6830 Body mass index (BMI) 30.0-30.9, adult: Secondary | ICD-10-CM

## 2023-10-21 DIAGNOSIS — E6609 Other obesity due to excess calories: Secondary | ICD-10-CM

## 2023-10-21 NOTE — Assessment & Plan Note (Signed)
Lab Results  Component Value Date   HGBA1C 5.7 (H) 10/06/2023   She had GDM during pregnancy but denies fam hx of diabetes Denies high intake of sugar Has been more sedentary at work  Will begin working on reducing intake of added sugar and refined carbohydrates Will plan to start tracking steps

## 2023-10-21 NOTE — Assessment & Plan Note (Signed)
Not on any daily preventive agents or prescription rescue medications  Consider use of topiramate for prevention and food impulse control

## 2023-10-21 NOTE — Progress Notes (Signed)
Office: [phone]  /  Fax: [phone]   Initial Visit  Bonnie Hughes was seen in clinic today to evaluate for obesity. She is interested in losing weight to improve overall health and reduce the risk of weight related complications. She presents today to review program treatment options, initial physical assessment, and evaluation.     She was referred by: PCP  When asked what else they would like to accomplish? She states: Improve appearance and Improve self-confidence  Weight history: weight has been 180-190 lb and then increased to 200 lb in the past 6 mos following a divorce and is a single mom to a 33 yo.  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    When asked how has your weight affected you? She states: Has affected mood   Some associated conditions: None  Contributing factors: Family history of obesity  Weight promoting medications identified: Contraceptives or hormonal therapy  Current nutrition plan: None  Current level of physical activity: None and NEAT  has a stationary bike, home workouts,   Current or previous pharmacotherapy: None  Response to medication: Never tried medications   Past medical history includes:   Past Medical History:  Diagnosis Date   Abnormal glucose tolerance test (GTT) during pregnancy, antepartum 03/04/2020   Cesarean delivery, delivered, current hospitalization 04/25/2020   Gestational diabetes    Heart murmur    Vaginal Pap smear, abnormal      Objective:   BP 113/78   Pulse 75   Temp 98.6 F (37 C)   Ht 5\' 8"  (1.727 m)   Wt 200 lb (90.7 kg)   LMP 09/13/2023 (Exact Date)   SpO2 98%   BMI 30.41 kg/m  She was weighed on the bioimpedance scale: Body mass index is 30.41 kg/m.  Peak Weight:200 , Body Fat%:38.4, Visceral Fat Rating:7, Weight trend over the last 12 months: Increasing  General:  Alert, oriented and cooperative. Patient is in no acute distress.  Respiratory: Normal respiratory effort, no problems with  respiration noted   Gait: able to ambulate independently  Mental Status: Normal mood and affect. Normal behavior. Normal judgment and thought content.   DIAGNOSTIC DATA REVIEWED:  BMET    Component Value Date/Time   NA 142 10/06/2023 0941   K 4.3 10/06/2023 0941   CL 106 10/06/2023 0941   CO2 21 10/06/2023 0941   GLUCOSE 101 (H) 10/06/2023 0941   BUN 10 10/06/2023 0941   CREATININE 0.89 10/06/2023 0941   CALCIUM 9.1 10/06/2023 0941   GFRNONAA 105 06/24/2019 1609   GFRAA 122 06/24/2019 1609   Lab Results  Component Value Date   HGBA1C 5.7 (H) 10/06/2023   No results found for: "INSULIN" CBC    Component Value Date/Time   WBC 7.1 10/06/2023 0941   WBC 13.1 (H) 04/26/2020 0441   RBC 4.15 10/06/2023 0941   RBC 3.25 (L) 04/26/2020 0441   HGB 13.2 10/06/2023 0941   HCT 40.4 10/06/2023 0941   PLT 290 10/06/2023 0941   MCV 97 10/06/2023 0941   MCH 31.8 10/06/2023 0941   MCH 28.6 04/26/2020 0441   MCHC 32.7 10/06/2023 0941   MCHC 31.0 04/26/2020 0441   RDW 12.3 10/06/2023 0941   Iron/TIBC/Ferritin/ %Sat No results found for: "IRON", "TIBC", "FERRITIN", "IRONPCTSAT" Lipid Panel     Component Value Date/Time   CHOL 232 (H) 10/06/2023 0941   TRIG 147 10/06/2023 0941   HDL 75 10/06/2023 0941   CHOLHDL 3.1 10/06/2023 0941   LDLCALC 131 (H)  10/06/2023 0941   Hepatic Function Panel     Component Value Date/Time   PROT 6.7 10/06/2023 0941   ALBUMIN 4.0 10/06/2023 0941   AST 11 10/06/2023 0941   ALT 6 10/06/2023 0941   ALKPHOS 54 10/06/2023 0941   BILITOT 0.4 10/06/2023 0941      Component Value Date/Time   TSH 1.690 10/06/2023 0941   TSH 1.210 06/24/2019 1609     Assessment and Plan:   Chronic migraine without aura without status migrainosus, not intractable Assessment & Plan: Not on any daily preventive agents or prescription rescue medications  Consider use of topiramate for prevention and food impulse control    Class 1 obesity due to excess calories  without serious comorbidity with body mass index (BMI) of 30.0 to 30.9 in adult  Prediabetes Assessment & Plan: Lab Results  Component Value Date   HGBA1C 5.7 (H) 10/06/2023   She had GDM during pregnancy but denies fam hx of diabetes Denies high intake of sugar Has been more sedentary at work  Will begin working on reducing intake of added sugar and refined carbohydrates Will plan to start tracking steps         Obesity Treatment / Action Plan:  Patient will work on garnering support from family and friends to begin weight loss journey. Will work on eliminating or reducing the presence of highly palatable, calorie dense foods in the home. Will complete provided nutritional and psychosocial assessment questionnaire before the next appointment. Will be scheduled for indirect calorimetry to determine resting energy expenditure in a fasting state.  This will allow [place] to create a reduced calorie, high-protein meal plan to promote loss of fat mass while preserving muscle mass. Will think about ideas on how to incorporate physical activity into their daily routine. Counseled on the health benefits of losing 5%-15% of total body weight. Was counseled on nutritional approaches to weight loss and benefits of reducing processed foods and consuming plant-based foods and high quality protein as part of nutritional weight management. Was counseled on pharmacotherapy and role as an adjunct in weight management.   Obesity Education Performed Today:  She was weighed on the bioimpedance scale and results were discussed and documented in the synopsis.  We discussed obesity as a disease and the importance of a more detailed evaluation of all the factors contributing to the disease.  We discussed the importance of long term lifestyle changes which include nutrition, exercise and behavioral modifications as well as the importance of customizing this to her specific health and social needs.  We  discussed the benefits of reaching a healthier weight to alleviate the symptoms of existing conditions and reduce the risks of the biomechanical, metabolic and psychological effects of obesity.  BEREA NOGUERAS appears to be in the action stage of change and states they are ready to start intensive lifestyle modifications and behavioral modifications.  23 minutes was spent today on this visit including the above counseling, pre-visit chart review, and post-visit documentation.  Reviewed by clinician on day of visit: allergies, medications, problem list, medical history, surgical history, family history, social history, and previous encounter notes pertinent to obesity diagnosis.    Seymour Bars, D.O. DABFM, DABOM [facility] [phone] [place], [place] [address] [phone]

## 2023-10-30 ENCOUNTER — Encounter: Payer: Self-pay | Admitting: Family Medicine

## 2023-10-30 ENCOUNTER — Ambulatory Visit: Payer: 59 | Admitting: Family Medicine

## 2023-10-30 ENCOUNTER — Encounter (HOSPITAL_BASED_OUTPATIENT_CLINIC_OR_DEPARTMENT_OTHER): Payer: Self-pay | Admitting: Family Medicine

## 2023-10-30 VITALS — BP 104/72 | HR 79 | Temp 98.0°F | Ht 68.0 in | Wt 197.4 lb

## 2023-10-30 DIAGNOSIS — R5383 Other fatigue: Secondary | ICD-10-CM

## 2023-10-30 DIAGNOSIS — E66811 Obesity, class 1: Secondary | ICD-10-CM

## 2023-10-30 DIAGNOSIS — R0602 Shortness of breath: Secondary | ICD-10-CM | POA: Diagnosis not present

## 2023-10-30 DIAGNOSIS — G43709 Chronic migraine without aura, not intractable, without status migrainosus: Secondary | ICD-10-CM

## 2023-10-30 DIAGNOSIS — Z6830 Body mass index (BMI) 30.0-30.9, adult: Secondary | ICD-10-CM | POA: Diagnosis not present

## 2023-10-30 DIAGNOSIS — E785 Hyperlipidemia, unspecified: Secondary | ICD-10-CM

## 2023-10-30 DIAGNOSIS — Z1331 Encounter for screening for depression: Secondary | ICD-10-CM | POA: Diagnosis not present

## 2023-10-30 DIAGNOSIS — R7303 Prediabetes: Secondary | ICD-10-CM

## 2023-10-30 NOTE — Assessment & Plan Note (Signed)
Lab Results  Component Value Date   HGBA1C 5.7 (H) 10/06/2023   She reports a hx of GDM. She is at higher risk for developing T2DM. She consumes some added sugar and refined carbohydrate snacks.  Check fasting insulin level today Begin prescribed dietary plan

## 2023-10-30 NOTE — Assessment & Plan Note (Signed)
Lab Results  Component Value Date   CHOL 232 (H) 10/06/2023   HDL 75 10/06/2023   LDLCALC 131 (H) 10/06/2023   TRIG 147 10/06/2023   CHOLHDL 3.1 10/06/2023   She is not on any lipid lowering medications  Begin prescribed dietary plan which is low in saturated fat

## 2023-10-30 NOTE — Assessment & Plan Note (Signed)
She has a hx of migraine headaches, not on any preventive RX agents  Work on improving sleep, nutrition and hydration Consider use of topiramate

## 2023-10-30 NOTE — Progress Notes (Signed)
At a Glance:  Vitals Temp: 98 F (36.7 C) BP: 104/72 Pulse Rate: 79 SpO2: 97 %   Anthropometric Measurements Height: 5\' 8"  (1.727 m) Weight: 197 lb 6.4 oz (89.5 kg) BMI (Calculated): 30.02 Starting Weight: 197lb Peak Weight: 200lb   Body Composition  Body Fat %: 39.1 % Fat Mass (lbs): 77.2 lbs Muscle Mass (lbs): 114.2 lbs Total Body Water (lbs): 84.4 lbs Visceral Fat Rating : 7   Other Clinical Data RMR: 1526 Fasting: Yes Labs: Yes Today's Visit #: 1 Starting Date: 10/30/23    EKG: Normal sinus rhythm, rate 81.  Indirect Calorimeter completed today shows a VO2 of 221 and a REE of 1526.  Her calculated basal metabolic rate is 4098 thus her basal metabolic rate is worse than expected.  Chief Complaint:  Obesity   Subjective:  Bonnie Hughes (MR# [id number]) is a 32 y.o. female who presents for evaluation and treatment of obesity and related comorbidities.   Bonnie Hughes is currently in the action stage of change and ready to dedicate time achieving and maintaining a healthier weight. Bonnie Hughes is interested in becoming our patient and working on intensive lifestyle modifications including (but not limited to) diet and exercise for weight loss.  Bonnie Hughes has been struggling with her weight. She has been unsuccessful in either losing weight, maintaining weight loss, or reaching her healthy weight goal.  Bonnie Hughes's habits were reviewed today and are as follows: her desired weight loss is 25 lb, she started gaining weight after having her 71 yo daughter due to stressors, she skips meals frequently, she is frequently drinking liquids with calories, and she struggles with emotional eating.   Other Fatigue Bonnie Hughes denies daytime somnolence and denies waking up still tired. Patient has a history of symptoms of daytime fatigue. Bonnie Hughes generally gets 6 or 7 hours of sleep per night, and states that she has nightime awakenings due to daughter waking up. Snoring is not present. Apneic episodes is  not present. Epworth Sleepiness Score is 13.   Shortness of Breath Bonnie Hughes notes increasing shortness of breath with exercising and seems to be worsening over time with weight gain. She notes getting out of breath sooner with activity than she used to. This has gotten worse recently. Bonnie Hughes denies shortness of breath at rest or orthopnea.   Depression Screen Bonnie Hughes's Food and Mood (modified PHQ-9) score was 13.     10/06/2023    8:47 AM  Depression screen PHQ 2/9  Decreased Interest 0  Down, Depressed, Hopeless 0  PHQ - 2 Score 0     Assessment and Plan:   Other Fatigue Bonnie Hughes does feel that her weight is causing her energy to be lower than it should be. Fatigue may be related to obesity, depression or many other causes. Labs will be ordered, and in the meanwhile, Bonnie Hughes will focus on self care including making healthy food choices, increasing physical activity and focusing on stress reduction.  Shortness of Breath Bonnie Hughes does feel that she gets out of breath more easily that she used to when she exercises. Bonnie Hughes's shortness of breath appears to be obesity related and exercise induced. She has agreed to work on weight loss and gradually increase exercise to treat her exercise induced shortness of breath. Will continue to monitor closely.  Bonnie Hughes had a positive depression screening. Depression is commonly associated with obesity and often results in emotional eating behaviors. We will monitor this closely and work on CBT to help improve the non-hunger eating patterns. Referral to Psychology  may be required if no improvement is seen as she continues in our clinic.    Problem List Items Addressed This Visit     Chronic migraine without aura without status migrainosus, not intractable    She has a hx of migraine headaches, not on any preventive RX agents  Work on improving sleep, nutrition and hydration Consider use of topiramate      Prediabetes - Primary    Lab Results  Component Value Date    HGBA1C 5.7 (H) 10/06/2023   She reports a hx of GDM. She is at higher risk for developing T2DM. She consumes some added sugar and refined carbohydrate snacks.  Check fasting insulin level today Begin prescribed dietary plan      Relevant Orders   Insulin, random   Hyperlipidemia    Lab Results  Component Value Date   CHOL 232 (H) 10/06/2023   HDL 75 10/06/2023   LDLCALC 131 (H) 10/06/2023   TRIG 147 10/06/2023   CHOLHDL 3.1 10/06/2023   She is not on any lipid lowering medications  Begin prescribed dietary plan which is low in saturated fat       Other Visit Diagnoses     SOBOE (shortness of breath on exertion)       Other fatigue       Relevant Orders   EKG 12-Lead   Folate   Vitamin B12   Ferritin   Iron and TIBC   Magnesium   Class 1 obesity due to excess calories with body mass index (BMI) of 30.0 to 30.9 in adult, unspecified whether serious comorbidity present           Bonnie Hughes is currently in the action stage of change and her goal is to continue with weight loss efforts. I recommend Bonnie Hughes begin the structured treatment plan as follows:  She has agreed to Category 2 Plan  Exercise goals: All adults should avoid inactivity. Some activity is better than none, and adults who participate in any amount of physical activity, gain some health benefits. - begin tracking daily steps  Behavioral modification strategies:increasing lean protein intake, decreasing simple carbohydrates, increasing vegetables, increase H2O intake, decrease liquid calories, decreasing eating out, no skipping meals, meal planning and cooking strategies, keeping healthy foods in the home, better snacking choices, and emotional eating strategies   She was informed of the importance of frequent follow-up visits to maximize her success with intensive lifestyle modifications for her multiple health conditions. She was informed we would discuss her lab results at her next visit unless there is a  critical issue that needs to be addressed sooner. Bonnie Hughes agreed to keep her next visit at the agreed upon time to discuss these results.  Objective:  General: Cooperative, alert, well developed, in no acute distress. HEENT: Conjunctivae and lids unremarkable. Cardiovascular: Regular rhythm.  Lungs: Normal work of breathing. Neurologic: No focal deficits.   Lab Results  Component Value Date   CREATININE 0.89 10/06/2023   BUN 10 10/06/2023   NA 142 10/06/2023   K 4.3 10/06/2023   CL 106 10/06/2023   CO2 21 10/06/2023   Lab Results  Component Value Date   ALT 6 10/06/2023   AST 11 10/06/2023   ALKPHOS 54 10/06/2023   BILITOT 0.4 10/06/2023   Lab Results  Component Value Date   HGBA1C 5.7 (H) 10/06/2023   No results found for: "INSULIN" Lab Results  Component Value Date   TSH 1.690 10/06/2023   Lab Results  Component Value  Date   CHOL 232 (H) 10/06/2023   HDL 75 10/06/2023   LDLCALC 131 (H) 10/06/2023   TRIG 147 10/06/2023   CHOLHDL 3.1 10/06/2023   Lab Results  Component Value Date   WBC 7.1 10/06/2023   HGB 13.2 10/06/2023   HCT 40.4 10/06/2023   MCV 97 10/06/2023   PLT 290 10/06/2023   No results found for: "IRON", "TIBC", "FERRITIN"  Attestation Statements:  Reviewed by clinician on day of visit: allergies, medications, problem list, medical history, surgical history, family history, social history, and previous encounter notes.  Time spent on visit including pre-visit chart review and post-visit charting and care was 40 minutes.   Glennis Brink, DO

## 2023-10-31 LAB — VITAMIN B12: Vitamin B-12: 238 pg/mL (ref 232–1245)

## 2023-10-31 LAB — IRON AND TIBC
Iron Saturation: 28 % (ref 15–55)
Iron: 113 ug/dL (ref 27–159)
Total Iron Binding Capacity: 399 ug/dL (ref 250–450)
UIBC: 286 ug/dL (ref 131–425)

## 2023-10-31 LAB — FERRITIN: Ferritin: 32 ng/mL (ref 15–150)

## 2023-10-31 LAB — INSULIN, RANDOM: INSULIN: 13.8 u[IU]/mL (ref 2.6–24.9)

## 2023-10-31 LAB — FOLATE: Folate: 13.4 ng/mL (ref >3.0)

## 2023-10-31 LAB — MAGNESIUM: Magnesium: 2 mg/dL (ref 1.6–2.3)

## 2023-11-03 DIAGNOSIS — Z01419 Encounter for gynecological examination (general) (routine) without abnormal findings: Secondary | ICD-10-CM | POA: Diagnosis not present

## 2023-11-12 ENCOUNTER — Encounter: Payer: Self-pay | Admitting: Family Medicine

## 2023-11-12 ENCOUNTER — Ambulatory Visit: Payer: 59 | Admitting: Family Medicine

## 2023-11-12 VITALS — BP 113/74 | HR 84 | Ht 68.0 in | Wt 200.0 lb

## 2023-11-12 DIAGNOSIS — Z6830 Body mass index (BMI) 30.0-30.9, adult: Secondary | ICD-10-CM

## 2023-11-12 DIAGNOSIS — E66811 Obesity, class 1: Secondary | ICD-10-CM

## 2023-11-12 DIAGNOSIS — R7989 Other specified abnormal findings of blood chemistry: Secondary | ICD-10-CM

## 2023-11-12 DIAGNOSIS — E6609 Other obesity due to excess calories: Secondary | ICD-10-CM

## 2023-11-12 DIAGNOSIS — R7303 Prediabetes: Secondary | ICD-10-CM | POA: Diagnosis not present

## 2023-11-12 NOTE — Progress Notes (Signed)
Office: [phone]  /  Fax: [phone]  WEIGHT SUMMARY AND BIOMETRICS  Starting Date: 10/30/23  Starting Weight: 197lb   Weight Lost Since Last Visit: 0lb   Vitals BP: 113/74 Pulse Rate: 84 SpO2: 98 %   Body Composition  Body Fat %: 38.5 % Fat Mass (lbs): 77 lbs Muscle Mass (lbs): 117 lbs Total Body Water (lbs): 87.6 lbs Visceral Fat Rating : 7     HPI  Chief Complaint: OBESITY  Bonnie Hughes is here to discuss her progress with her obesity treatment plan. She is on the the Category 4 Plan and states she is following her eating plan approximately 60 % of the time. She states she is exercising 0 minutes 0 times per week. (On cat 2)  Interval History:  Since last office visit she is up 3 lb She is trying out the foods on her plan She has some menstrual sugar cravings Denies meal skipping She did get a walking pad for work She is traveling to [employer] this weekend  Pharmacotherapy: none  PHYSICAL EXAM:  Blood pressure 113/74, pulse 84, height 5\' 8"  (1.727 m), weight 200 lb (90.7 kg), SpO2 98%. Body mass index is 30.41 kg/m.  General: She is overweight, cooperative, alert, well developed, and in no acute distress. PSYCH: Has normal mood, affect and thought process.   Lungs: Normal breathing effort, no conversational dyspnea.   ASSESSMENT AND PLAN  TREATMENT PLAN FOR OBESITY:  Recommended Dietary Goals  Bonnie Hughes is currently in the action stage of change. As such, her goal is to continue weight management plan. She has agreed to the Category 2 Plan.  Behavioral Intervention  We discussed the following Behavioral Modification Strategies today: increasing lean protein intake to established goals, increasing lower glycemic fruits, increasing fiber rich foods, avoiding skipping meals, increasing water intake , work on meal planning and preparation, keeping healthy foods at home, planning for success, and continue to work on maintaining a reduced calorie state, getting  the recommended amount of protein, incorporating whole foods, making healthy choices, staying well hydrated and practicing mindfulness when eating.. Reviewed dietary change goals to supplement her cat 2 meal plan on AVS She may sub a protein shake + a serving of fruit for breakfast  Additional resources provided today: NA  Recommended Physical Activity Goals  Bonnie Hughes has been advised to work up to 150 minutes of moderate intensity aerobic activity a week and strengthening exercises 2-3 times per week for cardiovascular health, weight loss maintenance and preservation of muscle mass.   She has agreed to Start aerobic activity with a goal of 150 minutes a week at moderate intensity.  Track daily steps with a goal of 10,000 steps/ day  Pharmacotherapy changes for the treatment of obesity: none  ASSOCIATED CONDITIONS ADDRESSED TODAY  Prediabetes Assessment & Plan: Lab Results  Component Value Date   HGBA1C 5.7 (H) 10/06/2023   She is not taking any medication  She has started to cut back on intake of sugar and starches on prescribed meal plan She has increased her steps  Consider the addition of metformin   Class 1 obesity due to excess calories with body mass index (BMI) of 30.0 to 30.9 in adult, unspecified whether serious comorbidity present  Low vitamin B12 level Assessment & Plan: Lab Results  Component Value Date   VITAMINB12 238 10/30/2023   Reviewed labs with patient She is taking a Women's MVI daily She c/o fatigue but denies brain fog or paresthesias She denies a vegan diet  Begin OTC vitamin B12 500 mcg daily       She was informed of the importance of frequent follow up visits to maximize her success with intensive lifestyle modifications for her multiple health conditions.   ATTESTASTION STATEMENTS:  Reviewed by clinician on day of visit: allergies, medications, problem list, medical history, surgical history, family history, social history, and previous  encounter notes pertinent to obesity diagnosis.   I have personally spent 30 minutes total time today in preparation, patient care, nutritional counseling and documentation for this visit, including the following: review of clinical lab tests; review of medical tests/procedures/services.      Glennis Brink, DO DABFM, DABOM [facility] [address] [place], [place] [address] [phone]

## 2023-11-12 NOTE — Patient Instructions (Addendum)
You can sub a protein shake + a fruit serving for breakfast  Continue on a Women's Multivitamin with iron in it daily  Begin a vitamin B12 supplement at 500 mcg daily  Increase water intake : 90 oz / day Add Colace 1-2 x a day for constipation  Keep dinners out 500-650 cal and this should be > 20 g of protein  Allow 2 servings of fresh or frozen fruit daily (any) You have the option to add 1/4 plate carb with dinner - preferably higher fiber Chickpea pasta / Barilla protein pasta Low carb tortilla Maisie Fus' 100 cal english muffin Keto bun Joseph's Lavish Beans Corn  Track daily steps and aim for 10,000 steps/ day

## 2023-11-12 NOTE — Assessment & Plan Note (Signed)
Lab Results  Component Value Date   HGBA1C 5.7 (H) 10/06/2023   She is not taking any medication  She has started to cut back on intake of sugar and starches on prescribed meal plan She has increased her steps  Consider the addition of metformin

## 2023-11-12 NOTE — Assessment & Plan Note (Signed)
Lab Results  Component Value Date   VITAMINB12 238 10/30/2023   Reviewed labs with patient She is taking a Women's MVI daily She c/o fatigue but denies brain fog or paresthesias She denies a vegan diet  Begin OTC vitamin B12 500 mcg daily

## 2023-11-13 ENCOUNTER — Ambulatory Visit: Payer: 59 | Admitting: Family Medicine

## 2023-12-18 ENCOUNTER — Ambulatory Visit: Payer: 59 | Admitting: Family Medicine

## 2023-12-18 ENCOUNTER — Encounter: Payer: Self-pay | Admitting: Family Medicine

## 2023-12-18 ENCOUNTER — Other Ambulatory Visit (HOSPITAL_COMMUNITY): Payer: Self-pay

## 2023-12-18 VITALS — BP 107/76 | HR 75 | Ht 68.0 in | Wt 198.0 lb

## 2023-12-18 DIAGNOSIS — R7303 Prediabetes: Secondary | ICD-10-CM

## 2023-12-18 DIAGNOSIS — R7989 Other specified abnormal findings of blood chemistry: Secondary | ICD-10-CM | POA: Diagnosis not present

## 2023-12-18 DIAGNOSIS — E66811 Obesity, class 1: Secondary | ICD-10-CM

## 2023-12-18 DIAGNOSIS — Z6830 Body mass index (BMI) 30.0-30.9, adult: Secondary | ICD-10-CM | POA: Diagnosis not present

## 2023-12-18 DIAGNOSIS — N764 Abscess of vulva: Secondary | ICD-10-CM | POA: Diagnosis not present

## 2023-12-18 MED ORDER — LOMAIRA 8 MG PO TABS
8.0000 mg | ORAL_TABLET | Freq: Two times a day (BID) | ORAL | 0 refills | Status: DC
  Filled 2023-12-18: qty 60, 30d supply, fill #0

## 2023-12-18 NOTE — Assessment & Plan Note (Signed)
 Lab Results  Component Value Date   HGBA1C 5.7 (H) 10/06/2023   She is doing well reducing intake of added sugar and starches She has made an effort to increase walking time and is motivated to continue to add to this  Continue to limit intake of sugar intake Repeat A1c in 2 mos

## 2023-12-18 NOTE — Assessment & Plan Note (Signed)
 Lab Results  Component Value Date   VITAMINB12 238 10/30/2023   She is doing well on OTC vitamin B12 500 mcg daily Energy level is improving Denies paresthesias or brain fog  Repeat B12 level in 2 mos

## 2023-12-18 NOTE — Progress Notes (Signed)
 Office: [phone]  /  Fax: [phone]  WEIGHT SUMMARY AND BIOMETRICS  Starting Date: 10/30/23  Starting Weight: 197lb   Weight Lost Since Last Visit: 2lb   Vitals BP: 107/76 Pulse Rate: 75 SpO2: 99 %   Body Composition  Body Fat %: 38 % Fat Mass (lbs): 75.4 lbs Muscle Mass (lbs): 116.6 lbs Total Body Water  (lbs): 83.2 lbs Visceral Fat Rating : 7     HPI  Chief Complaint: OBESITY  Bonnie Hughes is here to discuss her progress with her obesity treatment plan. She is on the the Category 3 Plan and states she is following her eating plan approximately 60 % of the time. She states she is exercising 0 minutes 0 times per week.   Interval History:  Since last office visit she is down 2 lb She has a net weight gain of 1 lb in the past 6 weeks She is using her walking pad, tracking steps and logging on the MyfitnessPal ap She is down 1.6 lb of body fat since last visit She is using her Dining Out Guide She has increased greek yogurt intake Fitting in more exercise has been challenging with work and her 25 yo daughter  Pharmacotherapy: none  PHYSICAL EXAM:  Blood pressure 107/76, pulse 75, height 5' 8 (1.727 m), weight 198 lb (89.8 kg), SpO2 99%. Body mass index is 30.11 kg/m.  General: She is overweight, cooperative, alert, well developed, and in no acute distress. PSYCH: Has normal mood, affect and thought process.   Lungs: Normal breathing effort, no conversational dyspnea.   ASSESSMENT AND PLAN  TREATMENT PLAN FOR OBESITY:  Recommended Dietary Goals  Bonnie Hughes is currently in the action stage of change. As such, her goal is to continue weight management plan. She has agreed to the Category 3 Plan. - can replace one meal per day with a protein shake + a fresh fruit serving  Behavioral Intervention  We discussed the following Behavioral Modification Strategies today: increasing lean protein intake to established goals, increasing vegetables, increasing water   intake , work on meal planning and preparation, keeping healthy foods at home, continue to work on implementation of reduced calorie nutritional plan, continue to practice mindfulness when eating, planning for success, and continue to work on maintaining a reduced calorie state, getting the recommended amount of protein, incorporating whole foods, making healthy choices, staying well hydrated and practicing mindfulness when eating..  Additional resources provided today: NA  Recommended Physical Activity Goals  Bonnie Hughes has been advised to work up to 150 minutes of moderate intensity aerobic activity a week and strengthening exercises 2-3 times per week for cardiovascular health, weight loss maintenance and preservation of muscle mass.   She has agreed to Start aerobic activity with a goal of 150 minutes a week at moderate intensity.  -Track daily steps with smart watch -think about 30 min walking pad in the late afternoon while daughter has playtime in the basement with her  Pharmacotherapy changes for the treatment of obesity: begin Lomaira  8 mg bid PDMP reviewed.  Informed consent signed Reviewed MOA and potential adverse SE Has form of birth control Will use along with prescribed diet and regular exercise  ASSOCIATED CONDITIONS ADDRESSED TODAY  Prediabetes Assessment & Plan: Lab Results  Component Value Date   HGBA1C 5.7 (H) 10/06/2023   She is doing well reducing intake of added sugar and starches She has made an effort to increase walking time and is motivated to continue to add to this  Continue to limit  intake of sugar intake Repeat A1c in 2 mos   Class 1 obesity due to excess calories with body mass index (BMI) of 30.0 to 30.9 in adult, unspecified whether serious comorbidity present -     Lomaira ; Take 1 tablet (8 mg total) by mouth 2 (two) times daily (30 min before breakfast and 1 hour after lunch).  Dispense: 60 tablet; Refill: 0  Low vitamin B12 level Assessment &  Plan: Lab Results  Component Value Date   VITAMINB12 238 10/30/2023   She is doing well on OTC vitamin B12 500 mcg daily Energy level is improving Denies paresthesias or brain fog  Repeat B12 level in 2 mos       She was informed of the importance of frequent follow up visits to maximize her success with intensive lifestyle modifications for her multiple health conditions.   ATTESTASTION STATEMENTS:  Reviewed by clinician on day of visit: allergies, medications, problem list, medical history, surgical history, family history, social history, and previous encounter notes pertinent to obesity diagnosis.   I have personally spent 30 minutes total time today in preparation, patient care, nutritional counseling and documentation for this visit, including the following: review of clinical lab tests; review of medical tests/procedures/services.      Bonnie FORBES Haddock, DO DABFM, DABOM [facility] [address] [place], [place] [address] [phone]

## 2023-12-25 DIAGNOSIS — F4322 Adjustment disorder with anxiety: Secondary | ICD-10-CM | POA: Diagnosis not present

## 2024-01-14 ENCOUNTER — Encounter: Payer: Self-pay | Admitting: Family Medicine

## 2024-01-14 ENCOUNTER — Ambulatory Visit: Payer: 59 | Admitting: Family Medicine

## 2024-01-14 ENCOUNTER — Other Ambulatory Visit (HOSPITAL_COMMUNITY): Payer: Self-pay

## 2024-01-14 VITALS — BP 111/78 | HR 81 | Ht 68.0 in | Wt 193.0 lb

## 2024-01-14 DIAGNOSIS — G44209 Tension-type headache, unspecified, not intractable: Secondary | ICD-10-CM | POA: Insufficient documentation

## 2024-01-14 DIAGNOSIS — E66811 Obesity, class 1: Secondary | ICD-10-CM | POA: Diagnosis not present

## 2024-01-14 DIAGNOSIS — Z6829 Body mass index (BMI) 29.0-29.9, adult: Secondary | ICD-10-CM | POA: Diagnosis not present

## 2024-01-14 DIAGNOSIS — R7989 Other specified abnormal findings of blood chemistry: Secondary | ICD-10-CM

## 2024-01-14 DIAGNOSIS — R7303 Prediabetes: Secondary | ICD-10-CM | POA: Diagnosis not present

## 2024-01-14 MED ORDER — LOMAIRA 8 MG PO TABS
8.0000 mg | ORAL_TABLET | Freq: Two times a day (BID) | ORAL | 0 refills | Status: DC
  Filled 2024-01-14: qty 60, 30d supply, fill #0

## 2024-01-14 NOTE — Progress Notes (Signed)
 Office: [phone]  /  Fax: [phone]  WEIGHT SUMMARY AND BIOMETRICS  Starting Date: 10/30/23  Starting Weight: 197lb   Weight Lost Since Last Visit: 5lb   Vitals BP: 111/78 Pulse Rate: 81 SpO2: 98 %   Body Composition  Body Fat %: 36.9 % Fat Mass (lbs): 71.2 lbs Muscle Mass (lbs): 115.6 lbs Total Body Water  (lbs): 82.6 lbs Visceral Fat Rating : 6    HPI  Chief Complaint: OBESITY  Bonnie Hughes is here to discuss her progress with her obesity treatment plan. She is on the the Category 3 Plan and states she is following her eating plan approximately 60 % of the time. She states she is exercising on the elliptical 45-60 minutes 2-3 times per week.   Interval History:  Since last office visit she is down 5 lb She 1 pound of muscle mass and 4.2 pounds of body fat since last visit This gives her a net weight loss of 4 pounds in the past 2 months of medically supervised weight management She has been skipping breakfast with the addition of Lomaira  last visit She is snacking less and eating less at meals She is tracking daily steps averaging 6000 steps a day She is walking or doing home exercise equipment 2-3 x a week She has had side effects of dry mouth, mild constipation and frequent headaches from the addition of Lomaira   Pharmacotherapy: Lomaira  8 mg tab twice daily  PHYSICAL EXAM:  Blood pressure 111/78, pulse 81, height 5' 8 (1.727 m), weight 193 lb (87.5 kg), SpO2 98%. Body mass index is 29.35 kg/m.  General: She is healthy appearing, cooperative, alert, well developed, and in no acute distress. PSYCH: Has normal mood, affect and thought process.   Lungs: Normal breathing effort, no conversational dyspnea.   ASSESSMENT AND PLAN  TREATMENT PLAN FOR OBESITY:  Recommended Dietary Goals  Bonnie Hughes is currently in the action stage of change. As such, her goal is to continue weight management plan. She has agreed to keeping a food journal and adhering to  recommended goals of 1200-1500 calories and 85 to 110 g of protein and practicing portion control and making smarter food choices, such as increasing vegetables and decreasing simple carbohydrates.  Behavioral Intervention  We discussed the following Behavioral Modification Strategies today: increasing lean protein intake to established goals, increasing fiber rich foods, increasing water  intake , work on meal planning and preparation, work on counselling psychologist calories using tracking application, keeping healthy foods at home, continue to practice mindfulness when eating, planning for success, and continue to work on maintaining a reduced calorie state, getting the recommended amount of protein, incorporating whole foods, making healthy choices, staying well hydrated and practicing mindfulness when eating..  Additional resources provided today: NA  Recommended Physical Activity Goals  Bonnie Hughes has been advised to work up to 150 minutes of moderate intensity aerobic activity a week and strengthening exercises 2-3 times per week for cardiovascular health, weight loss maintenance and preservation of muscle mass.   She has agreed to Increase the intensity, frequency or duration of aerobic exercises   Recommend continuing to track daily steps with a goal increase to 8000/day Continue vigorous workout either cardio or resistance training 2 to 3 days a week  Pharmacotherapy changes for the treatment of obesity: None  ASSOCIATED CONDITIONS ADDRESSED TODAY  Tension headache Assessment & Plan: New.  These are likely associated from use of Lomaira , worsened by lack of adequate water  intake and skipping breakfast in the morning.  she has also had some slight nausea without vomiting.  We discussed pros and cons of continuing Lomaira .  She is happy with her appetite control and weight reduction.  Her blood pressure and heart rate are within normal limits.  Recommend increasing water  intake to 100  ounces per day, adding in a sugar-free electrolyte packet once daily.  Recommend adding in a protein source in the morning instead of skipping meals like a fair life protein shake or a Greek yogurt along with 1 serving of fresh fruit in the morning.       Class 1 obesity due to excess calories with body mass index (BMI) of 30.0 to 30.9 in adult, unspecified whether serious comorbidity present -     Lomaira ; Take 1 tablet (8 mg total) by mouth 2 (two) times daily (30 min before breakfast and 1 hour after lunch).  Dispense: 60 tablet; Refill: 0  BMI 29.0-29.9,adult  Low vitamin B12 level Assessment & Plan: She has been consistent taking vitamin B12 500 mcg once daily for a low normal level at her last lab draw.  Energy levels have improved.  She denies paresthesias or brain fog.  Recheck vitamin B12 level next visit   Prediabetes Assessment & Plan: Lab Results  Component Value Date   HGBA1C 5.7 (H) 10/06/2023   She was just barely in the prediabetic range at her last lab draw.  She is doing well with weight reduction, dietary changes.  Recommend consistency with walking, dietary tracking and reducing intake of added sugar.  Repeat A1c next visit       She was informed of the importance of frequent follow up visits to maximize her success with intensive lifestyle modifications for her multiple health conditions.   ATTESTASTION STATEMENTS:  Reviewed by clinician on day of visit: allergies, medications, problem list, medical history, surgical history, family history, social history, and previous encounter notes pertinent to obesity diagnosis.   I have personally spent 30 minutes total time today in preparation, patient care, nutritional counseling and documentation for this visit, including the following: review of clinical lab tests; review of medical tests/procedures/services.      Bonnie FORBES Haddock, DO DABFM, DABOM [facility] [address] [place], [place] [address] [phone]

## 2024-01-14 NOTE — Patient Instructions (Addendum)
 Continue Lomaira  8 mg 2 x a day  Add in a protein shake (FairLife shake) and a fruit serving in the morning OR a greek yogurt and fruit serving  Improve water  intake You can add a sugar free electrolyte powder to help with headaches Propel, GZERO, sugar free Liquid  Increase daily step goal to 8,000 per day Get in a good vigorous workout 2-3 x a week  You should be getting in 1200-1500 cal/ day This should include 85-110 g of protein daily

## 2024-01-14 NOTE — Assessment & Plan Note (Signed)
 She has been consistent taking vitamin B12 500 mcg once daily for a low normal level at her last lab draw.  Energy levels have improved.  She denies paresthesias or brain fog.  Recheck vitamin B12 level next visit

## 2024-01-14 NOTE — Assessment & Plan Note (Signed)
 Lab Results  Component Value Date   HGBA1C 5.7 (H) 10/06/2023   She was just barely in the prediabetic range at her last lab draw.  She is doing well with weight reduction, dietary changes.  Recommend consistency with walking, dietary tracking and reducing intake of added sugar.  Repeat A1c next visit

## 2024-01-14 NOTE — Assessment & Plan Note (Signed)
 New.  These are likely associated from use of Lomaira , worsened by lack of adequate water  intake and skipping breakfast in the morning.  she has also had some slight nausea without vomiting.  We discussed pros and cons of continuing Lomaira .  She is happy with her appetite control and weight reduction.  Her blood pressure and heart rate are within normal limits.  Recommend increasing water  intake to 100 ounces per day, adding in a sugar-free electrolyte packet once daily.  Recommend adding in a protein source in the morning instead of skipping meals like a fair life protein shake or a Greek yogurt along with 1 serving of fresh fruit in the morning.

## 2024-01-15 DIAGNOSIS — F4322 Adjustment disorder with anxiety: Secondary | ICD-10-CM | POA: Diagnosis not present

## 2024-01-26 DIAGNOSIS — F4322 Adjustment disorder with anxiety: Secondary | ICD-10-CM | POA: Diagnosis not present

## 2024-02-02 DIAGNOSIS — F4322 Adjustment disorder with anxiety: Secondary | ICD-10-CM | POA: Diagnosis not present

## 2024-02-09 DIAGNOSIS — F4322 Adjustment disorder with anxiety: Secondary | ICD-10-CM | POA: Diagnosis not present

## 2024-02-10 ENCOUNTER — Encounter: Payer: Self-pay | Admitting: Family Medicine

## 2024-02-10 ENCOUNTER — Ambulatory Visit: Payer: 59 | Admitting: Family Medicine

## 2024-02-10 ENCOUNTER — Other Ambulatory Visit (HOSPITAL_COMMUNITY): Payer: Self-pay

## 2024-02-10 VITALS — BP 134/82 | HR 89 | Temp 98.3°F | Ht 68.0 in | Wt 191.0 lb

## 2024-02-10 DIAGNOSIS — R519 Headache, unspecified: Secondary | ICD-10-CM | POA: Diagnosis not present

## 2024-02-10 DIAGNOSIS — R7989 Other specified abnormal findings of blood chemistry: Secondary | ICD-10-CM

## 2024-02-10 DIAGNOSIS — Z6830 Body mass index (BMI) 30.0-30.9, adult: Secondary | ICD-10-CM

## 2024-02-10 DIAGNOSIS — E66811 Obesity, class 1: Secondary | ICD-10-CM | POA: Diagnosis not present

## 2024-02-10 DIAGNOSIS — R7303 Prediabetes: Secondary | ICD-10-CM

## 2024-02-10 MED ORDER — LOMAIRA 8 MG PO TABS
8.0000 mg | ORAL_TABLET | Freq: Every day | ORAL | 0 refills | Status: DC
  Filled 2024-02-10 – 2024-02-20 (×4): qty 30, 30d supply, fill #0

## 2024-02-10 NOTE — Progress Notes (Signed)
 Office: [phone]  /  Fax: [phone]  WEIGHT SUMMARY AND BIOMETRICS  Starting Date: 10/30/23  Starting Weight: 197lb   Weight Lost Since Last Visit: 2lb   Vitals Temp: 98.3 F (36.8 C) BP: 134/82 Pulse Rate: 89 SpO2: 97 %   Body Composition  Body Fat %: 36.4 % Fat Mass (lbs): 69.6 lbs Muscle Mass (lbs): 115.2 lbs Total Body Water (lbs): 80.4 lbs Visceral Fat Rating : 6     HPI  Chief Complaint: OBESITY  Bonnie Hughes is here to discuss her progress with her obesity treatment plan. She is on the the Category 4 Plan and states she is following her eating plan approximately 70 % of the time. She states she is exercising 30 minutes 2-3 times per week.   Interval History:  Since last office visit she is down 2 lb She has been mindful of her food choices with pre- planned meals using after meals and every plate, has plans to discontinue both due to increased calories and budget She has been cooking at home She has a net weight loss of 6 lb in the past 3 mos She is on Lomaira 8 mg bid with some improvements in appetite control She has breakfast at 9 am, (greek yogurt), lunch at 12:30 and dinner at 6.  She has not been snacking much She has been using home exercise equipment and has a walking pad at work She is averaging 6500 steps/ day She is still having headaches from Bonnie Hughes, averaging 2 to 3/week  Pharmacotherapy: Lomaira 8 mg tab twice daily  PHYSICAL EXAM:  Blood pressure 134/82, pulse 89, temperature 98.3 F (36.8 C), height 5\' 8"  (1.727 m), weight 191 lb (86.6 kg), SpO2 97%. Body mass index is 29.04 kg/m.  General: She is healthy appearing, cooperative, alert, well developed, and in no acute distress. PSYCH: Has normal mood, affect and thought process.   Lungs: Normal breathing effort, no conversational dyspnea.   ASSESSMENT AND PLAN  TREATMENT PLAN FOR OBESITY:  Recommended Dietary Goals  Bonnie Hughes is currently in the action stage of change. As such,  her goal is to continue weight management plan. She has agreed to the Category 3 Plan. She can replace breakfast with a protein shake or Bonnie Hughes yogurt and 1 serving of fresh fruits Reviewed the skinny taste website for ideas for meals with calorie content   Behavioral Intervention  We discussed the following Behavioral Modification Strategies today: increasing lean protein intake to established goals, increasing fiber rich foods, increasing water intake , work on meal planning and preparation, keeping healthy foods at home, practice mindfulness eating and understand the difference between hunger signals and cravings, work on managing stress, creating time for self-care and relaxation, avoiding temptations and identifying enticing environmental cues, and continue to work on maintaining a reduced calorie state, getting the recommended amount of protein, incorporating whole foods, making healthy choices, staying well hydrated and practicing mindfulness when eating..  Additional resources provided today: NA  Recommended Physical Activity Goals  Bonnie Hughes has been advised to work up to 150 minutes of moderate intensity aerobic activity a week and strengthening exercises 2-3 times per week for cardiovascular health, weight loss maintenance and preservation of muscle mass.   She has agreed to Increase the intensity, frequency or duration of aerobic exercises   Aim for 30 minutes or more of active play time or aerobic activities daily Track daily steps with a goal of 8000 or more daily  Pharmacotherapy changes for the treatment of obesity: Reduce  1 Lomaira down to 1 tab daily in the afternoon  ASSOCIATED CONDITIONS ADDRESSED TODAY  Low vitamin B12 level Lab Results  Component Value Date   VITAMINB12 238 10/30/2023   Her last B12 level was slightly low.  She has started his vitamin B-12 supplement at 500 mcg once daily.  Energy level has been improving.  She denies brain fog or paresthesias.  Recheck  level today -     Vitamin B12  Prediabetes Lab Results  Component Value Date   HGBA1C 5.7 (H) 10/06/2023   She has successfully lost 6 pounds in the past 3 months of medically supervised weight management.  She is working on reducing her intake of added sugar and refined carbohydrates along with increasing her aerobic activity.  Recheck A1c today  -     Hemoglobin A1c  Class 1 obesity due to excess calories with body mass index (BMI) of 30.0 to 30.9 in adult, unspecified whether serious comorbidity present -     Lomaira; Take 1 tablet (8 mg total) by mouth daily after lunch.  Dispense: 30 tablet; Refill: 0  Increased frequency of headaches She has seen a slight increase in headache frequency since starting Lomaira for medically supervised weight management.  These are not occurring daily and she has a history of both tension headache and migraine headaches.  She is using over-the-counter Tylenol or ibuprofen as needed but not on a daily basis.  She has done a better job of improving hydration and lean protein intake instead of skipping meals as recommended last visit.  Since she has plenty of appetite control early in the day and has continued to see a slight uptick in headache frequency on Lomaira, will reduce frequency to once daily taking only an 8 mg tab in the afternoon.     She was informed of the importance of frequent follow up visits to maximize her success with intensive lifestyle modifications for her multiple health conditions.   ATTESTASTION STATEMENTS:  Reviewed by clinician on day of visit: allergies, medications, problem list, medical history, surgical history, family history, social history, and previous encounter notes pertinent to obesity diagnosis.   I have personally spent 30 minutes total time today in preparation, patient care, nutritional counseling and documentation for this visit, including the following: review of clinical lab tests; review of medical  tests/procedures/services.      Bonnie Brink, DO DABFM, DABOM [facility] [address] [place], [place] [address] [phone]

## 2024-02-11 ENCOUNTER — Other Ambulatory Visit (HOSPITAL_COMMUNITY): Payer: Self-pay

## 2024-02-11 LAB — VITAMIN B12: Vitamin B-12: 1616 pg/mL — ABNORMAL HIGH (ref 232–1245)

## 2024-02-11 LAB — HEMOGLOBIN A1C
Est. average glucose Bld gHb Est-mCnc: 111 mg/dL
Hgb A1c MFr Bld: 5.5 % (ref 4.8–5.6)

## 2024-02-16 ENCOUNTER — Other Ambulatory Visit (HOSPITAL_COMMUNITY): Payer: Self-pay

## 2024-02-16 ENCOUNTER — Telehealth: Admitting: Physician Assistant

## 2024-02-16 DIAGNOSIS — H1032 Unspecified acute conjunctivitis, left eye: Secondary | ICD-10-CM

## 2024-02-16 DIAGNOSIS — F4322 Adjustment disorder with anxiety: Secondary | ICD-10-CM | POA: Diagnosis not present

## 2024-02-16 MED ORDER — OFLOXACIN 0.3 % OP SOLN
1.0000 [drp] | Freq: Four times a day (QID) | OPHTHALMIC | 0 refills | Status: DC
  Filled 2024-02-16: qty 5, 5d supply, fill #0

## 2024-02-16 NOTE — Progress Notes (Signed)

## 2024-02-20 ENCOUNTER — Other Ambulatory Visit (HOSPITAL_COMMUNITY): Payer: Self-pay

## 2024-02-23 DIAGNOSIS — F4322 Adjustment disorder with anxiety: Secondary | ICD-10-CM | POA: Diagnosis not present

## 2024-03-01 DIAGNOSIS — F4322 Adjustment disorder with anxiety: Secondary | ICD-10-CM | POA: Diagnosis not present

## 2024-03-08 DIAGNOSIS — F4322 Adjustment disorder with anxiety: Secondary | ICD-10-CM | POA: Diagnosis not present

## 2024-03-09 ENCOUNTER — Ambulatory Visit: Admitting: Family Medicine

## 2024-03-09 ENCOUNTER — Encounter: Payer: Self-pay | Admitting: Family Medicine

## 2024-03-09 VITALS — BP 124/84 | HR 85 | Temp 98.0°F | Ht 68.0 in | Wt 193.0 lb

## 2024-03-09 DIAGNOSIS — E538 Deficiency of other specified B group vitamins: Secondary | ICD-10-CM

## 2024-03-09 DIAGNOSIS — E66811 Obesity, class 1: Secondary | ICD-10-CM | POA: Diagnosis not present

## 2024-03-09 DIAGNOSIS — Z6830 Body mass index (BMI) 30.0-30.9, adult: Secondary | ICD-10-CM | POA: Diagnosis not present

## 2024-03-09 DIAGNOSIS — R7303 Prediabetes: Secondary | ICD-10-CM | POA: Diagnosis not present

## 2024-03-09 DIAGNOSIS — R7989 Other specified abnormal findings of blood chemistry: Secondary | ICD-10-CM

## 2024-03-09 NOTE — Patient Instructions (Signed)
 Continue to track daily calories  Aim for 1500 per day This should include 90-120 g of protein daily Read labels on food and drink for added sugar - limit products with over 8 g of added sugar per serving  Hydrate well with water/ sugar free drinks  Track daily steps with a goal of 10,000 per day  Aim for a good 8 hrs of sleep at night  Stop Lasandra Beech

## 2024-03-09 NOTE — Progress Notes (Signed)
 Office: [phone]  /  Fax: [phone]  WEIGHT SUMMARY AND BIOMETRICS  Starting Date: 10/30/23  Starting Weight: 197lb   Weight Lost Since Last Visit: 0lb   Vitals Temp: 98 F (36.7 C) BP: 124/84 Pulse Rate: 85 SpO2: 98 %   Body Composition  Body Fat %: 37.1 % Fat Mass (lbs): 71.6 lbs Muscle Mass (lbs): 115.4 lbs Total Body Water (lbs): 83 lbs Visceral Fat Rating : 6     HPI  Chief Complaint: OBESITY  Bonnie Hughes is here to discuss her progress with her obesity treatment plan. She is on the the Category 3 Plan and states she is following her eating plan approximately 50 % of the time. She states she is exercising 30 minutes 3-4 times per week.  Interval History:  Since last office visit she is up 2 lb This gives her a net weight loss of 4 lb in 4 mos She has forgotten to take PM dose of Lomaira and has had some palpitations She is using [employer] oversnacking She has tried to log her calories using the MyFitnessPal ap She has not been using her walking pad but she is walking some outdoors She has not been sleeping as well with 33 yo waking up more  Pharmacotherapy: Lomaira 8 mg tab at 2 pm daily  PHYSICAL EXAM:  Blood pressure 124/84, pulse 85, temperature 98 F (36.7 C), height 5\' 8"  (1.727 m), weight 193 lb (87.5 kg), SpO2 98%. Body mass index is 29.35 kg/m.  General: She is healthy appearing,  cooperative, alert, well developed, and in no acute distress. PSYCH: Has normal mood, affect and thought process.   Lungs: Normal breathing effort, no conversational dyspnea.   ASSESSMENT AND PLAN  TREATMENT PLAN FOR OBESITY:  Recommended Dietary Goals  Bonnie Hughes is currently in the action stage of change. As such, her goal is to continue weight management plan. She has agreed to keeping a food journal and adhering to recommended goals of 1500 calories and 100 g of  protein. BEGIN TRACKING DAILY INTAKE ON THE MYFITNESSPAL AP CALORIE/ PROTEIN  TARGET REVIEWED  Behavioral Intervention  We discussed the following Behavioral Modification Strategies today: increasing lean protein intake to established goals, increasing fiber rich foods, increasing water intake , work on tracking and journaling calories using tracking application, reading food labels , work on managing stress, creating time for self-care and relaxation, avoiding temptations and identifying enticing environmental cues, planning for success, and continue to work on maintaining a reduced calorie state, getting the recommended amount of protein, incorporating whole foods, making healthy choices, staying well hydrated and practicing mindfulness when eating..  Additional resources provided today: NA  Recommended Physical Activity Goals  Bonnie Hughes has been advised to work up to 150 minutes of moderate intensity aerobic activity a week and strengthening exercises 2-3 times per week for cardiovascular health, weight loss maintenance and preservation of muscle mass.   She has agreed to Start aerobic activity with a goal of 150 minutes a week at moderate intensity.  BEGIN TRACKING DAILY STEPS USING SMARTWATCH AIM FOR 10,000 STEPS DAILY  Pharmacotherapy changes for the treatment of obesity: DISCONTINUE LOMAIRA  ASSOCIATED CONDITIONS ADDRESSED TODAY  Prediabetes Lab Results  Component Value Date   HGBA1C 5.5 02/10/2024  Improved.  A1c has dropped from 5.7--> 5.5 with dietary changes.  She plans to ramp up her walking time.  We discussed a plan to continue to work on reducing body fat to <35% Aim for 10,000 steps/ day Avoid high  sugar foods and drinks  Class 1 obesity due to excess calories with body mass index (BMI) of 30.0 to 30.9 in adult, unspecified whether serious comorbidity present  Low vitamin B12 level Lab Results  Component Value Date   VITAMINB12 1,616 (H) 02/10/2024   B12 rose from low to high adding in vitamin D12 500 mcg daily She is now off of her B12  supplement and has changed over to a women's MVI daily     She was informed of the importance of frequent follow up visits to maximize her success with intensive lifestyle modifications for her multiple health conditions.   ATTESTASTION STATEMENTS:  Reviewed by clinician on day of visit: allergies, medications, problem list, medical history, surgical history, family history, social history, and previous encounter notes pertinent to obesity diagnosis.   I have personally spent 30 minutes total time today in preparation, patient care, nutritional counseling and documentation for this visit, including the following: review of clinical lab tests; review of medical tests/procedures/services.      Glennis Brink, DO DABFM, DABOM [facility] [address] [place], [place] [address] [phone]

## 2024-03-15 DIAGNOSIS — F4322 Adjustment disorder with anxiety: Secondary | ICD-10-CM | POA: Diagnosis not present

## 2024-03-22 DIAGNOSIS — F4322 Adjustment disorder with anxiety: Secondary | ICD-10-CM | POA: Diagnosis not present

## 2024-03-29 DIAGNOSIS — F4322 Adjustment disorder with anxiety: Secondary | ICD-10-CM | POA: Diagnosis not present

## 2024-04-05 DIAGNOSIS — F4322 Adjustment disorder with anxiety: Secondary | ICD-10-CM | POA: Diagnosis not present

## 2024-04-06 ENCOUNTER — Other Ambulatory Visit: Payer: Self-pay | Admitting: Family Medicine

## 2024-04-08 DIAGNOSIS — F4322 Adjustment disorder with anxiety: Secondary | ICD-10-CM | POA: Diagnosis not present

## 2024-04-12 ENCOUNTER — Encounter: Payer: Self-pay | Admitting: Nurse Practitioner

## 2024-04-12 ENCOUNTER — Ambulatory Visit: Admitting: Nurse Practitioner

## 2024-04-12 VITALS — BP 119/78 | HR 74 | Temp 98.1°F | Ht 68.0 in | Wt 191.0 lb

## 2024-04-12 DIAGNOSIS — E669 Obesity, unspecified: Secondary | ICD-10-CM

## 2024-04-12 DIAGNOSIS — Z6829 Body mass index (BMI) 29.0-29.9, adult: Secondary | ICD-10-CM

## 2024-04-12 DIAGNOSIS — E785 Hyperlipidemia, unspecified: Secondary | ICD-10-CM | POA: Diagnosis not present

## 2024-04-12 DIAGNOSIS — F4322 Adjustment disorder with anxiety: Secondary | ICD-10-CM | POA: Diagnosis not present

## 2024-04-12 NOTE — Progress Notes (Signed)
 Office: [phone]  /  Fax: [phone]  WEIGHT SUMMARY AND BIOMETRICS  Weight Lost Since Last Visit: 2lb  Weight Gained Since Last Visit: 0lb   Vitals Temp: 98.1 F (36.7 C) BP: 119/78 Pulse Rate: 74 SpO2: 96 %   Anthropometric Measurements Height: 5\' 8"  (1.727 m) Weight: 191 lb (86.6 kg) BMI (Calculated): 29.05 Weight at Last Visit: 193lb Weight Lost Since Last Visit: 2lb Weight Gained Since Last Visit: 0lb Starting Weight: 197lb Total Weight Loss (lbs): 6 lb (2.722 kg)   Body Composition  Body Fat %: 36.6 % Fat Mass (lbs): 70 lbs Muscle Mass (lbs): 115 lbs Total Body Water  (lbs): 80.6 lbs Visceral Fat Rating : 6   Other Clinical Data Fasting: No Labs: No Today's Visit #: 7 Starting Date: 10/30/23     HPI  Chief Complaint: OBESITY  Phara is here to discuss her progress with her obesity treatment plan. She is on the the Category 4 Plan and states she is following her eating plan approximately 60 % of the time. She states she is exercising 30 minutes 7 days per week.   Interval History:  Since last office visit she has lost 2 pounds.  She is using myfitness pal to track.  She does well with breakfast and lunch but doesn't track dinners.   She is averaging around 1400+ calories for breakfast and lunch, 189 carbs, 54 protein and 48 fats.  She is walking to stay active.  She is more active on the weekends.     Pharmacotherapy for weight loss: She is not currently taking  mediations  for medical weight loss.        Previous pharmacotherapy for medical weight loss: Lomaira -She stopped after last visit due to palpitations and headaches  Bariatric surgery:  Patient has not had bariatric surgery.    Hyperlipidemia Medication(s): None. Denies side effects.   Cardiovascular risk factors: FH:  father, pgm  Lab Results  Component Value Date   CHOL 232 (H) 10/06/2023   HDL 75 10/06/2023   LDLCALC 131 (H) 10/06/2023   TRIG 147 10/06/2023   CHOLHDL 3.1  10/06/2023   Lab Results  Component Value Date   ALT 6 10/06/2023   AST 11 10/06/2023   ALKPHOS 54 10/06/2023   BILITOT 0.4 10/06/2023   The ASCVD Risk score (Arnett DK, et al., 2019) failed to calculate for the following reasons:   The 2019 ASCVD risk score is only valid for ages 4 to 14    PHYSICAL EXAM:  Blood pressure 119/78, pulse 74, temperature 98.1 F (36.7 C), height 5\' 8"  (1.727 m), weight 191 lb (86.6 kg), last menstrual period 03/26/2024, SpO2 96%. Body mass index is 29.04 kg/m.  General: She is overweight, cooperative, alert, well developed, and in no acute distress. PSYCH: Has normal mood, affect and thought process.   Extremities: No edema.  Neurologic: No gross sensory or motor deficits. No tremors or fasciculations noted.    DIAGNOSTIC DATA REVIEWED:  BMET    Component Value Date/Time   NA 142 10/06/2023 0941   K 4.3 10/06/2023 0941   CL 106 10/06/2023 0941   CO2 21 10/06/2023 0941   GLUCOSE 101 (H) 10/06/2023 0941   BUN 10 10/06/2023 0941   CREATININE 0.89 10/06/2023 0941   CALCIUM 9.1 10/06/2023 0941   GFRNONAA 105 06/24/2019 1609   GFRAA 122 06/24/2019 1609   Lab Results  Component Value Date   HGBA1C 5.5 02/10/2024   HGBA1C 5.7 (H) 10/06/2023   Lab  Results  Component Value Date   INSULIN  13.8 10/30/2023   Lab Results  Component Value Date   TSH 1.690 10/06/2023   CBC    Component Value Date/Time   WBC 7.1 10/06/2023 0941   WBC 13.1 (H) 04/26/2020 0441   RBC 4.15 10/06/2023 0941   RBC 3.25 (L) 04/26/2020 0441   HGB 13.2 10/06/2023 0941   HCT 40.4 10/06/2023 0941   PLT 290 10/06/2023 0941   MCV 97 10/06/2023 0941   MCH 31.8 10/06/2023 0941   MCH 28.6 04/26/2020 0441   MCHC 32.7 10/06/2023 0941   MCHC 31.0 04/26/2020 0441   RDW 12.3 10/06/2023 0941   Iron Studies    Component Value Date/Time   IRON 113 10/30/2023 0825   TIBC 399 10/30/2023 0825   FERRITIN 32 10/30/2023 0825   IRONPCTSAT 28 10/30/2023 0825   Lipid Panel      Component Value Date/Time   CHOL 232 (H) 10/06/2023 0941   TRIG 147 10/06/2023 0941   HDL 75 10/06/2023 0941   CHOLHDL 3.1 10/06/2023 0941   LDLCALC 131 (H) 10/06/2023 0941   Hepatic Function Panel     Component Value Date/Time   PROT 6.7 10/06/2023 0941   ALBUMIN 4.0 10/06/2023 0941   AST 11 10/06/2023 0941   ALT 6 10/06/2023 0941   ALKPHOS 54 10/06/2023 0941   BILITOT 0.4 10/06/2023 0941      Component Value Date/Time   TSH 1.690 10/06/2023 0941   TSH 1.210 06/24/2019 1609   Nutritional Lab Results  Component Value Date   VD25OH 53.7 10/06/2023     ASSESSMENT AND PLAN  TREATMENT PLAN FOR OBESITY:  Recommended Dietary Goals  Herma is currently in the action stage of change. As such, her goal is to continue weight management plan. She has agreed to keeping a food journal and adhering to recommended goals of 1500 calories and 100+ grams protein. I've encouraged her to track and will review at next visit.    Behavioral Intervention  We discussed the following Behavioral Modification Strategies today: increasing lean protein intake to established goals, decreasing simple carbohydrates , increasing fiber rich foods, increasing water  intake , work on meal planning and preparation, work on tracking and journaling calories using tracking application, and continue to work on maintaining a reduced calorie state, getting the recommended amount of protein, incorporating whole foods, making healthy choices, staying well hydrated and practicing mindfulness when eating..  Additional resources provided today: NA  Recommended Physical Activity Goals  Baeleigh has been advised to work up to 150 minutes of moderate intensity aerobic activity a week and strengthening exercises 2-3 times per week for cardiovascular health, weight loss maintenance and preservation of muscle mass.   She has agreed to Think about enjoyable ways to increase daily physical activity and overcoming barriers to  exercise, Increase physical activity in their day and reduce sedentary time (increase NEAT)., Increase the intensity, frequency or duration of strengthening exercises , and Increase the intensity, frequency or duration of aerobic exercises     Pharmacotherapy Avoid Phentermine , Lomiara and Qsymia due to side effects to Lomaira .    ASSOCIATED CONDITIONS ADDRESSED TODAY  Action/Plan  Hyperlipidemia, unspecified hyperlipidemia type Cardiovascular risk and specific lipid/LDL goals reviewed.  We discussed several lifestyle modifications today and Nannetta will continue to work on diet, exercise and weight loss efforts. Orders and follow up as documented in patient record.   Counseling Intensive lifestyle modifications are the first line treatment for this issue. Dietary changes: Increase  soluble fiber. Decrease simple carbohydrates. Exercise changes: Moderate to vigorous-intensity aerobic activity 150 minutes per week if tolerated. Lipid-lowering medications: see documented in medical record.   Generalized obesity  BMI 29.0-29.9,adult     Goals: Track and aim for 1500 calories and >100 grams of protein Increase water  intake Exercise-discussed cardio and resistance training.     Return in about 4 weeks (around 05/10/2024).Aaron Aas She was informed of the importance of frequent follow up visits to maximize her success with intensive lifestyle modifications for her multiple health conditions.   ATTESTASTION STATEMENTS:  Reviewed by clinician on day of visit: allergies, medications, problem list, medical history, surgical history, family history, social history, and previous encounter notes.   Time spent on visit including pre-visit chart review and post-visit care and charting was 30 minutes discussing macros-protein, carbs and fats and exercising.    Crist Dominion. Hayslee Casebolt FNP-C

## 2024-04-14 DIAGNOSIS — F4322 Adjustment disorder with anxiety: Secondary | ICD-10-CM | POA: Diagnosis not present

## 2024-04-26 DIAGNOSIS — F4322 Adjustment disorder with anxiety: Secondary | ICD-10-CM | POA: Diagnosis not present

## 2024-05-06 ENCOUNTER — Ambulatory Visit (HOSPITAL_COMMUNITY)
Admission: EM | Admit: 2024-05-06 | Discharge: 2024-05-06 | Disposition: A | Discharge status: Home / Self Care | Attending: Family Medicine | Admitting: Family Medicine

## 2024-05-06 ENCOUNTER — Encounter (HOSPITAL_COMMUNITY): Payer: Self-pay | Admitting: Emergency Medicine

## 2024-05-06 DIAGNOSIS — R197 Diarrhea, unspecified: Secondary | ICD-10-CM | POA: Diagnosis not present

## 2024-05-06 DIAGNOSIS — F4322 Adjustment disorder with anxiety: Secondary | ICD-10-CM | POA: Diagnosis not present

## 2024-05-06 DIAGNOSIS — R110 Nausea: Secondary | ICD-10-CM | POA: Diagnosis not present

## 2024-05-06 DIAGNOSIS — R1013 Epigastric pain: Secondary | ICD-10-CM | POA: Insufficient documentation

## 2024-05-06 LAB — COMPREHENSIVE METABOLIC PANEL WITH GFR
ALT: 8 U/L (ref 0–44)
AST: 15 U/L (ref 15–41)
Albumin: 3.5 g/dL (ref 3.5–5.0)
Alkaline Phosphatase: 42 U/L (ref 38–126)
Anion gap: 11 (ref 5–15)
BUN: 9 mg/dL (ref 6–20)
CO2: 23 mmol/L (ref 22–32)
Calcium: 8.8 mg/dL — ABNORMAL LOW (ref 8.9–10.3)
Chloride: 103 mmol/L (ref 98–111)
Creatinine, Ser: 0.78 mg/dL (ref 0.44–1.00)
GFR, Estimated: >60 mL/min (ref >60)
Glucose, Bld: 92 mg/dL (ref 70–99)
Potassium: 3.8 mmol/L (ref 3.5–5.1)
Sodium: 137 mmol/L (ref 135–145)
Total Bilirubin: 0.5 mg/dL (ref 0.0–1.2)
Total Protein: 6.6 g/dL (ref 6.5–8.1)

## 2024-05-06 LAB — CBC WITH DIFFERENTIAL/PLATELET
Abs Immature Granulocytes: 0.04 10*3/uL (ref 0.00–0.07)
Basophils Absolute: 0 10*3/uL (ref 0.0–0.1)
Basophils Relative: 0 %
Eosinophils Absolute: 0.1 10*3/uL (ref 0.0–0.5)
Eosinophils Relative: 1 %
HCT: 38.6 % (ref 36.0–46.0)
Hemoglobin: 13.5 g/dL (ref 12.0–15.0)
Immature Granulocytes: 1 %
Lymphocytes Relative: 24 %
Lymphs Abs: 2.1 10*3/uL (ref 0.7–4.0)
MCH: 32.6 pg (ref 26.0–34.0)
MCHC: 35 g/dL (ref 30.0–36.0)
MCV: 93.2 fL (ref 80.0–100.0)
Monocytes Absolute: 0.8 10*3/uL (ref 0.1–1.0)
Monocytes Relative: 9 %
Neutro Abs: 5.7 10*3/uL (ref 1.7–7.7)
Neutrophils Relative %: 65 %
Platelets: 213 10*3/uL (ref 150–400)
RBC: 4.14 MIL/uL (ref 3.87–5.11)
RDW: 12.4 % (ref 11.5–15.5)
WBC: 8.7 10*3/uL (ref 4.0–10.5)
nRBC: 0 % (ref 0.0–0.2)

## 2024-05-06 MED ORDER — ONDANSETRON 4 MG PO TBDP
4.0000 mg | ORAL_TABLET | Freq: Three times a day (TID) | ORAL | 0 refills | Status: AC | PRN
Start: 2024-05-06 — End: ?

## 2024-05-06 MED ORDER — DICYCLOMINE HCL 20 MG PO TABS: 20.0000 mg | ORAL_TABLET | Freq: Four times a day (QID) | ORAL | 0 refills | Status: DC | PRN

## 2024-05-06 NOTE — ED Provider Notes (Signed)
 [facility]    CSN: [id number] Arrival date & time: 05/06/24  1708      History   Chief Complaint Chief Complaint  Patient presents with   Abdominal Pain    HPI Bonnie Hughes is a 33 y.o. female.    Abdominal Pain  Here for abdominal pain in her epigastrium that began yesterday.  It will be a severe cramp that lasts for 2 or 3 seconds and then is relieved.  She does have nausea with it some.  She has had several loose stools today that were yellow and green at first.  She took some Pepto and now her stools have been black.  No fever or chills and no upper respiratory symptoms.  She has not actually vomited so far.  NKDA  Last menstrual cycle was May 8  The pain is been happening every 20 or 30 minutes. Past Medical History:  Diagnosis Date   Abnormal glucose tolerance test (GTT) during pregnancy, antepartum 03/04/2020   Cesarean delivery, delivered, current hospitalization 04/25/2020   Constipation    Gestational diabetes    Heart murmur    Pre-diabetes    Swelling of lower extremity during pregnancy    Vaginal Pap smear, abnormal     Patient Active Problem List   Diagnosis Date Noted   Increased frequency of headaches 02/10/2024   Tension headache 01/14/2024   Low vitamin B12 level 11/12/2023   Hyperlipidemia 10/30/2023   Prediabetes 10/21/2023   Class 1 obesity due to excess calories without serious comorbidity with body mass index (BMI) of 31.0 to 31.9 in adult 10/06/2023   Irregular periods 12/30/2017   Chronic migraine without aura without status migrainosus, not intractable 01/20/2016    Past Surgical History:  Procedure Laterality Date   CESAREAN SECTION N/A 04/25/2020   Procedure: CESAREAN SECTION;  Surgeon: Reggy Capers, MD;  Location: [facility];  Service: Obstetrics;  Laterality: N/A;   COLPOSCOPY      OB History     Gravida  1   Para  1   Term  1   Preterm      AB      Living  1      SAB      IAB      Ectopic       Multiple  0   Live Births  1            Home Medications    Prior to Admission medications   Medication Sig Start Date End Date Taking? Authorizing Provider  dicyclomine (BENTYL) 20 MG tablet Take 1 tablet (20 mg total) by mouth 4 (four) times daily as needed (intestinal cramps). 05/06/24  Yes Ann Keto, MD  ondansetron  (ZOFRAN -ODT) 4 MG disintegrating tablet Take 1 tablet (4 mg total) by mouth every 8 (eight) hours as needed for nausea or vomiting. 05/06/24  Yes Ann Keto, MD  ESTARYLLA 0.25-35 MG-MCG tablet Take by mouth. 09/22/23   [provider]    Family History Family History  Problem Relation Age of Onset   Heart disease Father    Hyperlipidemia Father    Hypertension Father    Obesity Father    Anemia Maternal Grandmother    Heart disease Maternal Grandmother    Hyperlipidemia Paternal Grandmother    Hypertension Paternal Grandmother    Heart disease Paternal Grandmother    Glaucoma Paternal Grandmother     Social History Social History   Tobacco Use   Smoking status:  Never   Smokeless tobacco: Never  Vaping Use   Vaping status: Never Used  Substance Use Topics   Alcohol use: No   Drug use: No     Allergies   Patient has no known allergies.   Review of Systems Review of Systems  Gastrointestinal:  Positive for abdominal pain.     Physical Exam Triage Vital Signs ED Triage Vitals  Encounter Vitals Group     BP 05/06/24 1733 107/70     Systolic BP Percentile --      Diastolic BP Percentile --      Pulse Rate 05/06/24 1733 91     Resp 05/06/24 1733 17     Temp 05/06/24 1733 98.3 F (36.8 C)     Temp Source 05/06/24 1733 Oral     SpO2 05/06/24 1733 97 %     Weight --      Height --      Head Circumference --      Peak Flow --      Pain Score 05/06/24 1732 4     Pain Loc --      Pain Education --      Exclude from Growth Chart --    No data found.  Updated Vital Signs BP 107/70 (BP Location: Left Arm)    Pulse 91   Temp 98.3 F (36.8 C) (Oral)   Resp 17   LMP 04/15/2024 (Approximate)   SpO2 97%   Visual Acuity Right Eye Distance:   Left Eye Distance:   Bilateral Distance:    Right Eye Near:   Left Eye Near:    Bilateral Near:     Physical Exam Vitals reviewed.  Constitutional:      General: She is not in acute distress.    Appearance: She is not ill-appearing, toxic-appearing or diaphoretic.  HENT:     Mouth/Throat:     Mouth: Mucous membranes are moist.  Eyes:     Extraocular Movements: Extraocular movements intact.     Conjunctiva/sclera: Conjunctivae normal.     Pupils: Pupils are equal, round, and reactive to light.  Cardiovascular:     Rate and Rhythm: Normal rate and regular rhythm.     Heart sounds: No murmur heard. Pulmonary:     Effort: Pulmonary effort is normal.     Breath sounds: Normal breath sounds.  Abdominal:     General: Bowel sounds are normal. There is no distension.     Palpations: Abdomen is soft.     Comments: Exam is nontender.  No guarding  Musculoskeletal:     Cervical back: Neck supple.  Lymphadenopathy:     Cervical: No cervical adenopathy.  Skin:    Capillary Refill: Capillary refill takes less than 2 seconds.     Coloration: Skin is not pale.  Neurological:     General: No focal deficit present.     Mental Status: She is alert and oriented to person, place, and time.  Psychiatric:        Behavior: Behavior normal.      UC Treatments / Results  Labs (all labs ordered are listed, but only abnormal results are displayed) Labs Reviewed  COMPREHENSIVE METABOLIC PANEL WITH GFR  CBC WITH DIFFERENTIAL/PLATELET    EKG   Radiology No results found.  Procedures Procedures (including critical care time)  Medications Ordered in UC Medications - No data to display  Initial Impression / Assessment and Plan / UC Course  I have reviewed the triage vital signs  and the nursing notes.  Pertinent labs & imaging results that were  available during my care of the patient were reviewed by me and considered in my medical decision making (see chart for details).     CBC and CMP are drawn today.  We will notify her if anything is significantly abnormal.  Dicyclomine and ondansetron  are sent in for her symptoms.  If she worsens in any way, I want her to go to the emergency room.  We discussed that she may end up needing some advanced imaging. Final Clinical Impressions(s) / UC Diagnoses   Final diagnoses:  Epigastric pain  Nausea  Diarrhea, unspecified type     Discharge Instructions      We have drawn blood to check your blood counts, electrolytes and sugar and kidney and liver function.  Staff will notify you if anything is significantly abnormal.  Dicyclomine--take 1 every 6 hours as needed for intestinal cramps  Ondansetron  dissolved in the mouth every 8 hours as needed for nausea or vomiting. Clear liquids(water , gatorade/pedialyte, ginger ale/sprite, chicken broth/soup) and bland things(crackers/toast, rice, potato, bananas) to eat. Avoid acidic foods like lemon/lime/orange/tomato, and avoid greasy/spicy foods.  Please go to the ER if you are not improving with the treatments provided, or if you are worsening in any way.   ED Prescriptions     Medication Sig Dispense Auth. Provider   dicyclomine (BENTYL) 20 MG tablet Take 1 tablet (20 mg total) by mouth 4 (four) times daily as needed (intestinal cramps). 20 tablet Ann Keto, MD   ondansetron  (ZOFRAN -ODT) 4 MG disintegrating tablet Take 1 tablet (4 mg total) by mouth every 8 (eight) hours as needed for nausea or vomiting. 10 tablet Ellsworth Haas Salwa Bai K, MD      PDMP not reviewed this encounter.   Ann Keto, MD 05/06/24 [phone]

## 2024-05-06 NOTE — Discharge Instructions (Addendum)
 We have drawn blood to check your blood counts, electrolytes and sugar and kidney and liver function.  Staff will notify you if anything is significantly abnormal.  Dicyclomine--take 1 every 6 hours as needed for intestinal cramps  Ondansetron  dissolved in the mouth every 8 hours as needed for nausea or vomiting. Clear liquids(water , gatorade/pedialyte, ginger ale/sprite, chicken broth/soup) and bland things(crackers/toast, rice, potato, bananas) to eat. Avoid acidic foods like lemon/lime/orange/tomato, and avoid greasy/spicy foods.  Please go to the ER if you are not improving with the treatments provided, or if you are worsening in any way.

## 2024-05-06 NOTE — ED Triage Notes (Signed)
 Pt had mid upper abd pains for over 24 hours. Nausea was worse yesterday than today but also not eating today. Reports had yellowish stools that were diarrhea. Took Pepto Bismol that helped little and turned stools black.

## 2024-05-07 ENCOUNTER — Encounter: Payer: Self-pay | Admitting: Family Medicine

## 2024-05-07 ENCOUNTER — Ambulatory Visit (HOSPITAL_COMMUNITY): Payer: Self-pay

## 2024-05-10 ENCOUNTER — Ambulatory Visit (INDEPENDENT_AMBULATORY_CARE_PROVIDER_SITE_OTHER): Admitting: Family Medicine

## 2024-05-10 ENCOUNTER — Encounter: Payer: Self-pay | Admitting: Family Medicine

## 2024-05-10 VITALS — BP 125/78 | HR 78 | Ht 68.0 in | Wt 191.0 lb

## 2024-05-10 DIAGNOSIS — E663 Overweight: Secondary | ICD-10-CM

## 2024-05-10 DIAGNOSIS — Z658 Other specified problems related to psychosocial circumstances: Secondary | ICD-10-CM | POA: Diagnosis not present

## 2024-05-10 DIAGNOSIS — Z6829 Body mass index (BMI) 29.0-29.9, adult: Secondary | ICD-10-CM | POA: Diagnosis not present

## 2024-05-10 NOTE — Patient Instructions (Addendum)
 Aim for 10,000 steps per day goals Weight training from home 3-4 days/ wk  Hydrate well with water  Aim for 1500 cal/ day Don't track workouts into calorie tracking app Daily protein goal 90-110 g/ day Fruits and veggies daily

## 2024-05-10 NOTE — Progress Notes (Signed)
 Office: [phone]  /  Fax: [phone]  WEIGHT SUMMARY AND BIOMETRICS  Starting Date: 10/30/23  Starting Weight: 197lb   Weight Lost Since Last Visit: 0lb   Vitals BP: 125/78 Pulse Rate: 78 SpO2: 96 %   Body Composition  Body Fat %: 36.9 % Fat Mass (lbs): 70.4 lbs Muscle Mass (lbs): 114.6 lbs Total Body Water  (lbs): 80.8 lbs Visceral Fat Rating : 6   HPI  Chief Complaint: OBESITY  Bonnie Hughes is here to discuss her progress with her obesity treatment plan. She is on the the Category 3 Plan and states she is following her eating plan approximately 60 % of the time. She states she is exercising 30-60 minutes 3 times per week.  Interval History:  Since last office visit she is down 0 lb She had a break up since last visit and was sick between visits She is using [employer] meal plans She started a weight training workout from home She had come off Lomaira  8 mg bid due to side effects She has a net weight loss of 6 lb in 6 mos  Pharmacotherapy: none  PHYSICAL EXAM:  Blood pressure 125/78, pulse 78, height 5\' 8"  (1.727 m), weight 191 lb (86.6 kg), last menstrual period 04/15/2024, SpO2 96%. Body mass index is 29.04 kg/m.  General: She is healthy appearing,  cooperative, alert, well developed, and in no acute distress. PSYCH: Has normal mood, affect and thought process.   Lungs: Normal breathing effort, no conversational dyspnea.  ASSESSMENT AND PLAN  TREATMENT PLAN FOR OBESITY:  Recommended Dietary Goals  Bonnie Hughes is currently in the action stage of change. As such, her goal is to continue weight management plan. She has agreed to keeping a food journal and adhering to recommended goals of 1500 calories and 90-110 g of  protein and practicing portion control and making smarter food choices, such as increasing vegetables and decreasing simple carbohydrates.  Behavioral Intervention  We discussed the following Behavioral Modification Strategies today: increasing  lean protein intake to established goals, increasing fiber rich foods, increasing water  intake , work on tracking and journaling calories using tracking application, keeping healthy foods at home, decreasing eating out or consumption of processed foods, and making healthy choices when eating convenient foods, practice mindfulness eating and understand the difference between hunger signals and cravings, work on managing stress, creating time for self-care and relaxation, avoiding temptations and identifying enticing environmental cues, and continue to work on maintaining a reduced calorie state, getting the recommended amount of protein, incorporating whole foods, making healthy choices, staying well hydrated and practicing mindfulness when eating..  Additional resources provided today: NA  Recommended Physical Activity Goals  Bonnie Hughes has been advised to work up to 150 minutes of moderate intensity aerobic activity a week and strengthening exercises 2-3 times per week for cardiovascular health, weight loss maintenance and preservation of muscle mass.   She has agreed to Increase the intensity, frequency or duration of strengthening exercises  and Increase the intensity, frequency or duration of aerobic exercises    Pharmacotherapy changes for the treatment of obesity: none  ASSOCIATED CONDITIONS ADDRESSED TODAY  Psychosocial stressors Worsened by recent break up Denies intake of high sugar / comfort foods Plans to ramp up exercise and start counseling  Continue to work on improving sleep, nutrition, exercise, self are and start counseling  Overweight (BMI 25.0-29.9)  BMI 29.0-29.9,adult      She was informed of the importance of frequent follow up visits to maximize her success with intensive  lifestyle modifications for her multiple health conditions.   ATTESTASTION STATEMENTS:  Reviewed by clinician on day of visit: allergies, medications, problem list, medical history, surgical  history, family history, social history, and previous encounter notes pertinent to obesity diagnosis.   I have personally spent 20 minutes total time today in preparation, patient care, nutritional counseling and education,  and documentation for this visit, including the following: review of most recent clinical lab tests,reviewing medical assistant documentation, review and interpretation of bioimpedence results.     Micky Albee, D.O. DABFM, DABOM [facility] [address] [place], [place] [address] [phone]

## 2024-05-15 ENCOUNTER — Telehealth: Admitting: Physician Assistant

## 2024-05-15 DIAGNOSIS — J208 Acute bronchitis due to other specified organisms: Secondary | ICD-10-CM

## 2024-05-15 DIAGNOSIS — B9689 Other specified bacterial agents as the cause of diseases classified elsewhere: Secondary | ICD-10-CM | POA: Diagnosis not present

## 2024-05-15 MED ORDER — AZITHROMYCIN 250 MG PO TABS: ORAL_TABLET | ORAL | 0 refills | Status: AC

## 2024-05-15 MED ORDER — BENZONATATE 100 MG PO CAPS: 100.0000 mg | ORAL_CAPSULE | Freq: Three times a day (TID) | ORAL | 0 refills | Status: AC | PRN

## 2024-05-15 NOTE — Progress Notes (Signed)
 I have spent 5 minutes in review of e-visit questionnaire, review and updating patient chart, medical decision making and response to patient.   Piedad Climes, PA-C

## 2024-05-15 NOTE — Progress Notes (Signed)

## 2024-06-08 ENCOUNTER — Ambulatory Visit: Admitting: Family Medicine

## 2024-06-15 DIAGNOSIS — F411 Generalized anxiety disorder: Secondary | ICD-10-CM | POA: Diagnosis not present

## 2024-06-18 ENCOUNTER — Other Ambulatory Visit (HOSPITAL_COMMUNITY): Payer: Self-pay

## 2024-06-18 DIAGNOSIS — R1030 Lower abdominal pain, unspecified: Secondary | ICD-10-CM | POA: Diagnosis not present

## 2024-06-18 DIAGNOSIS — R1031 Right lower quadrant pain: Secondary | ICD-10-CM | POA: Diagnosis not present

## 2024-06-18 DIAGNOSIS — N760 Acute vaginitis: Secondary | ICD-10-CM | POA: Diagnosis not present

## 2024-06-18 DIAGNOSIS — N898 Other specified noninflammatory disorders of vagina: Secondary | ICD-10-CM | POA: Diagnosis not present

## 2024-06-18 MED ORDER — METRONIDAZOLE 500 MG PO TABS
500.0000 mg | ORAL_TABLET | Freq: Two times a day (BID) | ORAL | 0 refills | Status: AC
  Filled 2024-06-18: qty 14, 7d supply, fill #0

## 2024-06-22 ENCOUNTER — Ambulatory Visit: Admitting: Family Medicine

## 2024-06-22 ENCOUNTER — Encounter: Payer: Self-pay | Admitting: Family Medicine

## 2024-06-22 VITALS — BP 98/68 | HR 72 | Temp 98.2°F | Ht 68.0 in | Wt 185.0 lb

## 2024-06-22 DIAGNOSIS — Z658 Other specified problems related to psychosocial circumstances: Secondary | ICD-10-CM | POA: Diagnosis not present

## 2024-06-22 DIAGNOSIS — E663 Overweight: Secondary | ICD-10-CM

## 2024-06-22 DIAGNOSIS — Z6828 Body mass index (BMI) 28.0-28.9, adult: Secondary | ICD-10-CM

## 2024-06-22 NOTE — Progress Notes (Signed)
 Office: [phone]  /  Fax: [phone]  WEIGHT SUMMARY AND BIOMETRICS  Starting Date: 10/30/23  Starting Weight: 197lb   Weight Lost Since Last Visit: 6lb   Vitals Temp: 98.2 F (36.8 C) BP: 98/68 Pulse Rate: 72 SpO2: 97 %   Body Composition  Body Fat %: 35.2 % Fat Mass (lbs): 65.4 lbs Muscle Mass (lbs): 114.4 lbs Total Body Water  (lbs): 80 lbs Visceral Fat Rating : 5    HPI  Chief Complaint: OBESITY  Bonnie Hughes is here to discuss her progress with her obesity treatment plan. She is on the the Category 4 Plan and states she is following her eating plan approximately 60 % of the time. She states she is exercising 30 minutes 4 times per week.  Interval History:  Since last office visit she is down 6 lb This gives her a net weight loss of 12 pounds in the past 7 months of medically supervised weight management She has been able to preserve her lean body mass over the past month She has been doing [employer] HIIT training 4 days/ wk She has some work stress but has been walking more at work She is using [employer] for recipe planning Mood has been stable after a recent break-up She is more prone to skipping meals when her daughter is with her dad She limits meals out and meal planning She is working on water  intake  Pharmacotherapy: none  PHYSICAL EXAM:  Blood pressure 98/68, pulse 72, temperature 98.2 F (36.8 C), height 5' 8 (1.727 m), weight 185 lb (83.9 kg), SpO2 97%. Body mass index is 28.13 kg/m.  General: She is healthy appearing,  cooperative, alert, well developed, and in no acute distress. PSYCH: Has normal mood, affect and thought process.   Lungs: Normal breathing effort, no conversational dyspnea.   ASSESSMENT AND PLAN  TREATMENT PLAN FOR OBESITY:  Recommended Dietary Goals  [name] is currently in the action stage of change. As such, her goal is to continue weight management plan. She has agreed to practicing portion control and making  smarter food choices, such as increasing vegetables and decreasing simple carbohydrates.  Behavioral Intervention  We discussed the following Behavioral Modification Strategies today: increasing lean protein intake to established goals, increasing fiber rich foods, increasing water  intake , work on meal planning and preparation, planning for success, and continue to work on maintaining a reduced calorie state, getting the recommended amount of protein, incorporating whole foods, making healthy choices, staying well hydrated and practicing mindfulness when eating..  Additional resources provided today: NA  Recommended Physical Activity Goals  Issis has been advised to work up to 150 minutes of moderate intensity aerobic activity a week and strengthening exercises 2-3 times per week for cardiovascular health, weight loss maintenance and preservation of muscle mass.   She has agreed to Work on scheduling and tracking physical activity.   Pharmacotherapy changes for the treatment of obesity: None  ASSOCIATED CONDITIONS ADDRESSED TODAY  Psychosocial stressors Improved.  She has been able to work on self-care, sleep, good nutrition and regular exercise.  Mood has been stable.  Emotional eating under good control.  She is happy to see her progress.  Overweight (BMI 25.0-29.9) Improving.  Reviewed bioimpedance results with patient.  She is doing great with a reduced calorie healthy diet and lifestyle change in general.  She has ramped up exercise to include both cardio and resistance training 4 to 5 days a week.  She is within 15 pounds of her target weight with  a body fat percent goal around 30%.  BMI 28.0-28.9,adult      She was informed of the importance of frequent follow up visits to maximize her success with intensive lifestyle modifications for her multiple health conditions.   ATTESTASTION STATEMENTS:  Reviewed by clinician on day of visit: allergies, medications, problem list,  medical history, surgical history, family history, social history, and previous encounter notes pertinent to obesity diagnosis.      Darice Haddock, D.O. DABFM, DABOM [facility] [address] [place], [place] [address] [phone]

## 2024-07-01 DIAGNOSIS — F4322 Adjustment disorder with anxiety: Secondary | ICD-10-CM | POA: Diagnosis not present

## 2024-07-08 DIAGNOSIS — F411 Generalized anxiety disorder: Secondary | ICD-10-CM | POA: Diagnosis not present

## 2024-07-15 DIAGNOSIS — F411 Generalized anxiety disorder: Secondary | ICD-10-CM | POA: Diagnosis not present

## 2024-08-02 ENCOUNTER — Ambulatory Visit: Admitting: Family Medicine

## 2024-08-17 ENCOUNTER — Ambulatory Visit: Admitting: Family Medicine

## 2024-12-20 ENCOUNTER — Encounter (HOSPITAL_COMMUNITY): Payer: Self-pay

## 2024-12-20 ENCOUNTER — Other Ambulatory Visit (HOSPITAL_COMMUNITY): Payer: Self-pay

## 2024-12-20 MED ORDER — NORGESTIMATE-ETH ESTRADIOL 0.25-35 MG-MCG PO TABS
1.0000 | ORAL_TABLET | Freq: Every day | ORAL | 3 refills | Status: AC
  Filled 2024-12-20 – 2025-01-08 (×4): qty 84, 84d supply, fill #0

## 2024-12-27 ENCOUNTER — Telehealth: Admitting: Physician Assistant

## 2024-12-27 ENCOUNTER — Other Ambulatory Visit (HOSPITAL_COMMUNITY): Payer: Self-pay

## 2024-12-27 DIAGNOSIS — R3989 Other symptoms and signs involving the genitourinary system: Secondary | ICD-10-CM

## 2024-12-27 MED ORDER — CEPHALEXIN 500 MG PO CAPS
500.0000 mg | ORAL_CAPSULE | Freq: Two times a day (BID) | ORAL | 0 refills | Status: AC
  Filled 2024-12-27 – 2024-12-28 (×2): qty 14, 7d supply, fill #0

## 2024-12-27 MED ORDER — CEPHALEXIN 500 MG PO CAPS: 500.0000 mg | ORAL_CAPSULE | Freq: Two times a day (BID) | ORAL | 0 refills | Status: DC

## 2024-12-27 NOTE — Addendum Note (Signed)
 Addended byBETHA ROLAN BERTHOLD on: 12/27/2024 08:08 PM   Modules accepted: Orders

## 2024-12-27 NOTE — Progress Notes (Signed)

## 2024-12-28 ENCOUNTER — Other Ambulatory Visit (HOSPITAL_COMMUNITY): Payer: Self-pay

## 2025-01-08 ENCOUNTER — Other Ambulatory Visit (HOSPITAL_COMMUNITY): Payer: Self-pay

## 2025-01-10 ENCOUNTER — Telehealth

## 2025-01-10 DIAGNOSIS — R399 Unspecified symptoms and signs involving the genitourinary system: Secondary | ICD-10-CM

## 2025-01-11 NOTE — Progress Notes (Signed)
" °  Because your symptoms continue to persist despite use of the medication and medication administration, I feel your condition warrants further evaluation and I recommend that you be seen in a face-to-face visit.   NOTE: There will be NO CHARGE for this E-Visit   If you are having a true medical emergency, please call 911.     For an urgent face to face visit, [place] has multiple urgent care centers for your convenience.  Click the link below for the full list of locations and hours, walk-in wait times, appointment scheduling options and driving directions:  Urgent Care - [place], [place], [place], [place], [place], [place]  [place]     Your MyChart E-visit questionnaire answers were reviewed by a board certified advanced clinical practitioner to complete your personal care plan based on your specific symptoms.    Thank you for using e-Visits.    "

## 2025-01-12 ENCOUNTER — Other Ambulatory Visit (HOSPITAL_COMMUNITY): Payer: Self-pay

## 2025-01-12 MED ORDER — NITROFURANTOIN MONOHYD MACRO 100 MG PO CAPS
100.0000 mg | ORAL_CAPSULE | Freq: Two times a day (BID) | ORAL | 0 refills | Status: AC
  Filled 2025-01-12: qty 10, 5d supply, fill #0
